# Patient Record
Sex: Male | Born: 1985 | Race: White | Hispanic: No | Marital: Single | Smoking: Current some day smoker
Health system: Southern US, Community
[De-identification: ages and names within clinical notes are randomized; demographics above are authoritative.]

## PROBLEM LIST (undated history)

## (undated) DIAGNOSIS — I10 Essential (primary) hypertension: Secondary | ICD-10-CM

## (undated) DIAGNOSIS — K219 Gastro-esophageal reflux disease without esophagitis: Secondary | ICD-10-CM

## (undated) HISTORY — PX: WISDOM TOOTH EXTRACTION: SHX21

## (undated) HISTORY — DX: Essential (primary) hypertension: I10

---
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
## 2004-05-20 ENCOUNTER — Emergency Department (HOSPITAL_COMMUNITY): Admission: EM | Admit: 2004-05-20 | Discharge: 2004-05-20 | Payer: Self-pay | Admitting: Emergency Medicine

## 2006-03-09 ENCOUNTER — Emergency Department (HOSPITAL_COMMUNITY): Admission: EM | Admit: 2006-03-09 | Discharge: 2006-03-09 | Payer: Self-pay | Admitting: Emergency Medicine

## 2013-08-22 ENCOUNTER — Ambulatory Visit (INDEPENDENT_AMBULATORY_CARE_PROVIDER_SITE_OTHER): Payer: Managed Care, Other (non HMO) | Admitting: Internal Medicine

## 2013-08-22 VITALS — BP 120/80 | HR 65 | Temp 98.7°F | Resp 16 | Ht 72.25 in | Wt 232.0 lb

## 2013-08-22 DIAGNOSIS — L02226 Furuncle of umbilicus: Secondary | ICD-10-CM

## 2013-08-22 DIAGNOSIS — L02239 Carbuncle of trunk, unspecified: Secondary | ICD-10-CM

## 2013-08-22 MED ORDER — CEPHALEXIN 500 MG PO CAPS: 500.0000 mg | ORAL_CAPSULE | Freq: Four times a day (QID) | ORAL | Status: DC

## 2013-08-22 NOTE — Progress Notes (Signed)
  Subjective:    Patient ID: Terry Mathis, male    DOB: July 15, 1986, 27 y.o.   MRN: [id number]  HPI Blood in the belly button and tenderness in the area. He drove for 10 hours yesterday and had his seatbelt on does not know if this irritated his belly button. The blood is dried. No fever no vomiting no diarrhea. Is not actively bleeding today.   Review of Systems  Constitutional: Negative.   HENT: Negative.   Eyes: Negative.   Respiratory: Negative.   Cardiovascular: Negative.   Gastrointestinal: Negative.   Endocrine: Negative.   Genitourinary: Negative.   Musculoskeletal: Negative.   Skin: Positive for wound.       Inside belly button  Allergic/Immunologic: Negative.   Neurological: Negative.   Hematological: Negative.   Psychiatric/Behavioral: Negative.   All other systems reviewed and are negative.       Objective:   Physical Exam  Constitutional: He is oriented to person, place, and time. He appears well-developed and well-nourished.  HENT:  Head: Normocephalic and atraumatic.  Eyes: Conjunctivae and EOM are normal. Pupils are equal, round, and reactive to light.  Pulmonary/Chest: Effort normal and breath sounds normal.  Abdominal: Soft. There is tenderness.  Dried blood in the navel. Cleansed with ns on a q tip. No active bleeding. Slightly tender. No hernia no erythema to the surrounding area.  Musculoskeletal: Normal range of motion.  Neurological: He is alert and oriented to person, place, and time.  Psychiatric: He has a normal mood and affect. His behavior is normal. Judgment and thought content normal.          Assessment & Plan:  Trauma to navel. Cleansed with saline. Apply neosporin. Take antibiotic as directed.

## 2013-08-22 NOTE — Patient Instructions (Signed)
Cleanse your navel with soap and water. Apply neosporin. Keflex as directed. Return for reeval if no improvement.

## 2013-10-12 ENCOUNTER — Ambulatory Visit (INDEPENDENT_AMBULATORY_CARE_PROVIDER_SITE_OTHER): Payer: Managed Care, Other (non HMO) | Admitting: Family Medicine

## 2013-10-12 VITALS — BP 128/84 | HR 86 | Temp 98.5°F | Resp 18 | Ht 67.0 in | Wt 231.0 lb

## 2013-10-12 DIAGNOSIS — L0390 Cellulitis, unspecified: Secondary | ICD-10-CM

## 2013-10-12 DIAGNOSIS — R21 Rash and other nonspecific skin eruption: Secondary | ICD-10-CM

## 2013-10-12 DIAGNOSIS — L0291 Cutaneous abscess, unspecified: Secondary | ICD-10-CM

## 2013-10-12 MED ORDER — DOXYCYCLINE HYCLATE 100 MG PO TABS: 100.0000 mg | ORAL_TABLET | Freq: Two times a day (BID) | ORAL | Status: DC

## 2013-10-12 NOTE — Progress Notes (Signed)
Subjective:    Patient ID: Terry Mathis, male    DOB: 06-09-1986, 27 y.o.   MRN: [id number]  HPI This chart was scribed for Terry Mathis-MD, by Ladona Ridgel Nefertari Rebman, Scribe. This patient was seen in room 13 and the patient's care was started at 6:47 PM.  HPI Comments: WILIAM Mathis is a 27 y.o. male who works as a Games developer for [employer] and lives at home.  Today he presents to the [facility] complaining of a constant, gradually worsened, itchy diffuse and erythematous rash to his lower abdomen, onset 3 days ago. He reports mild associated pain and discomfort to the rash. He denies any associated fever/chills nausea or emesis episodes.  History reviewed. No pertinent past medical history.  History reviewed. No pertinent past surgical history.  Family History  Problem Relation Age of Onset  . Diabetes Mother   . Seizures Mother   . Hypertension Father     History   Social History  . Marital Status: Single    Spouse Name: N/A    Number of Children: N/A  . Years of Education: N/A   Occupational History  . Not on file.   Social History Main Topics  . Smoking status: Current Every Eder Macek Smoker  . Smokeless tobacco: Not on file  . Alcohol Use: Not on file  . Drug Use: Not on file  . Sexual Activity: Not on file   Other Topics Concern  . Not on file   Social History Narrative  . No narrative on file    No Known Allergies  There are no active problems to display for this patient.     No results found for this or any previous visit.  1. Rash   2. Cellulitis     Meds ordered this encounter  Medications  . doxycycline (VIBRA-TABS) 100 MG tablet    Sig: Take 1 tablet (100 mg total) by mouth 2 (two) times daily.    Dispense:  20 tablet    Refill:  0      Review of Systems  Constitutional: Negative for fever and chills.  Respiratory: Negative for cough and shortness of breath.   Cardiovascular: Negative for chest pain.  Gastrointestinal:  Negative for nausea and vomiting.  Skin: Positive for rash (itchy red rash lower abdomen).   Triage Vitals: BP 128/84  Pulse 86  Temp(Src) 98.5 F (36.9 C)  Resp 18  Ht 5\' 7"  (1.702 m)  Wt 231 lb (104.781 kg)  BMI 36.17 kg/m2  SpO2 99%     Objective:   Physical Exam Physical Exam  Nursing note and vitals reviewed. Constitutional: Patient is oriented to person, place, and time. Patient appears well-developed and well-nourished. No distress.  HENT:  Head: Normocephalic and atraumatic.  Neck: Neck supple. No tracheal deviation present.  Cardiovascular: Normal rate, regular rhythm and normal heart sounds.   No murmur heard. Pulmonary/Chest: Effort normal and breath sounds normal. No respiratory distress. Patient has no wheezes. Patient has no rales.  Musculoskeletal: Normal range of motion.  Neurological: Patient is alert and oriented to person, place, and time.  Skin: Skin is warm and dry. Ellptical and erythematous patches along belt line both RLQ and LLQ. W/ satellite pustules. Psychiatric: Patient has a normal mood and affect. Patient's behavior is normal.         Assessment & Plan:   Rash - Plan: doxycycline (VIBRA-TABS) 100 MG tablet  Cellulitis - Plan: doxycycline (VIBRA-TABS) 100 MG tablet  Signed,  Elvina Sidle, MD

## 2013-11-15 ENCOUNTER — Ambulatory Visit (INDEPENDENT_AMBULATORY_CARE_PROVIDER_SITE_OTHER): Payer: Managed Care, Other (non HMO) | Admitting: Family Medicine

## 2013-11-15 VITALS — BP 146/90 | HR 94 | Temp 98.5°F | Resp 16 | Ht 68.0 in | Wt 230.2 lb

## 2013-11-15 DIAGNOSIS — K2970 Gastritis, unspecified, without bleeding: Secondary | ICD-10-CM

## 2013-11-15 DIAGNOSIS — R21 Rash and other nonspecific skin eruption: Secondary | ICD-10-CM

## 2013-11-15 DIAGNOSIS — R030 Elevated blood-pressure reading, without diagnosis of hypertension: Secondary | ICD-10-CM

## 2013-11-15 DIAGNOSIS — R1013 Epigastric pain: Secondary | ICD-10-CM

## 2013-11-15 DIAGNOSIS — F17200 Nicotine dependence, unspecified, uncomplicated: Secondary | ICD-10-CM

## 2013-11-15 DIAGNOSIS — IMO0001 Reserved for inherently not codable concepts without codable children: Secondary | ICD-10-CM

## 2013-11-15 LAB — POCT SKIN KOH: Skin KOH, POC: NEGATIVE

## 2013-11-15 LAB — POCT CBC
HCT, POC: 42.9 % — AB (ref 43.5–53.7)
Hemoglobin: 13.7 g/dL — AB (ref 14.1–18.1)
MCHC: 31.9 g/dL (ref 31.8–35.4)
MPV: 8 fL (ref 0–99.8)
POC Granulocyte: 4.1 (ref 2–6.9)
POC LYMPH PERCENT: 34.8 %L (ref 10–50)
RBC: 4.73 M/uL (ref 4.69–6.13)
RDW, POC: 12.9 %

## 2013-11-15 MED ORDER — TRIAMCINOLONE ACETONIDE 0.1 % EX CREA: 1.0000 "application " | TOPICAL_CREAM | Freq: Two times a day (BID) | CUTANEOUS | Status: DC

## 2013-11-15 MED ORDER — OMEPRAZOLE 40 MG PO CPDR: 40.0000 mg | DELAYED_RELEASE_CAPSULE | Freq: Every day | ORAL | Status: DC

## 2013-11-15 MED ORDER — SUCRALFATE 1 G PO TABS: 1.0000 g | ORAL_TABLET | Freq: Three times a day (TID) | ORAL | Status: DC

## 2013-11-15 NOTE — Progress Notes (Signed)
Subjective:  This chart was scribed for Norberto Sorenson, MD by Quintella Reichert, ED scribe.  This patient was seen in [facility] Room 12 and the patient's care was started at 6:43 PM.   Patient ID: Terry Mathis, male    DOB: [birth year], 27 y.o.   MRN: [id number]  Chief Complaint  Patient presents with  . Abdominal Pain    Upper, thinks may be from heartburn, X 1 month    HPI  HPI Comments: Terry Mathis is a 27 y.o. male who presents to [facility] complaining of one month of mild-to-moderate epigastric abdominal pain.  Pt attributes his pain to GERD.  He states that he used to have "burning" chest pain due to GERD and has been taking OTC Prilosec 1x/day for 8-9 months with complete relief of chest pain.  However over the past month in spite of continuing to use Prilosec he has developed a new painful sensation to the left and central epigastric abdomen with rare radiation to the back.  He drinks socially from around 5PM-2AM every weekend and states his pain is worsened the day after drinking alcohol.  He does not drink EtOH at all during the week.  He is also a current half-pack-per-day smoker and states his pain is worsened by smoking.  It is greatly worsened if he forgets to take his Prilosec. Worse w/ spicy foods - esp [place]. Yesterday he also felt nauseated on waking but he denies vomiting.  He denies constipation, diarrhea, blood in stools, or melena.  Pt states he does have a very unhealthy diet - eats out almost constantly, lots of fast food.  Pt was seen here on 11/26 for a "staph infection" on his lower abdomen as well as a rash.  He was prescribed doxycycline which helped the staph infection. Was told to use otc lamisil for his rash but that seems to be getting worse. Very itchy - more so when scratching it.  History reviewed. No pertinent past medical history.  Current Outpatient Prescriptions on File Prior to Visit  Medication Sig Dispense Refill  . doxycycline (VIBRA-TABS) 100 MG tablet  Take 1 tablet (100 mg total) by mouth 2 (two) times daily.  20 tablet  0   No current facility-administered medications on file prior to visit.    No Known Allergies   Review of Systems  Constitutional: Positive for appetite change. Negative for fever, chills, diaphoresis and activity change.  Respiratory: Negative for shortness of breath.   Cardiovascular: Negative for chest pain.  Gastrointestinal: Positive for nausea and abdominal pain. Negative for vomiting, diarrhea, constipation, blood in stool, abdominal distention, anal bleeding and rectal pain.  Genitourinary: Negative for dysuria, frequency and decreased urine volume.  Skin: Positive for color change and rash. Negative for wound.  Hematological: Negative for adenopathy.      BP 146/90  Pulse 94  Temp(Src) 98.5 F (36.9 C) (Oral)  Resp 16  Ht 5\' 8"  (1.727 m)  Wt 230 lb 3.2 oz (104.418 kg)  BMI 35.01 kg/m2  SpO2 98%  Objective:   Physical Exam  Nursing note and vitals reviewed. Constitutional: He is oriented to person, place, and time. He appears well-developed and well-nourished. No distress.  HENT:  Head: Normocephalic and atraumatic.  Eyes: EOM are normal.  Neck: Neck supple. No tracheal deviation present.  Cardiovascular: Normal rate, regular rhythm and normal heart sounds.   No murmur heard. Pulmonary/Chest: Effort normal and breath sounds normal. No respiratory distress. He has no wheezes. He has  no rales.  Abdominal: Soft. Bowel sounds are normal. He exhibits no distension and no mass. There is no hepatosplenomegaly. There is no tenderness.  Musculoskeletal: Normal range of motion.  Neurological: He is alert and oriented to person, place, and time.  Skin: Skin is warm and dry. Rash noted.  2x4-cm oval erythematous plaque, with fine scale  Psychiatric: He has a normal mood and affect. His behavior is normal.    Results for orders placed in visit on 11/15/13  POCT CBC      Result Value Range   WBC 7.0   4.6 - 10.2 K/uL   Lymph, poc 2.4  0.6 - 3.4   POC LYMPH PERCENT 34.8  10 - 50 %L   MID (cbc) 0.4  0 - 0.9   POC MID % 6.3  0 - 12 %M   POC Granulocyte 4.1  2 - 6.9   Granulocyte percent 58.9  37 - 80 %G   RBC 4.73  4.69 - 6.13 M/uL   Hemoglobin 13.7 (*) 14.1 - 18.1 g/dL   HCT, POC 40.9 (*) 81.1 - 53.7 %   MCV 90.7  80 - 97 fL   MCH, POC 29.0  27 - 31.2 pg   MCHC 31.9  31.8 - 35.4 g/dL   RDW, POC 91.4     Platelet Count, POC 236  142 - 424 K/uL   MPV 8.0  0 - 99.8 fL  POCT SKIN KOH      Result Value Range   Skin KOH, POC Negative        Assessment & Plan:  Tobacco use disorder - not ready to quit.  Elevated blood pressure - Normal prior - rec dec EtoH, smoking, salt, inc water.  Abdominal pain, epigastric - Plan: POCT CBC, Comprehensive metabolic panel, H. pylori antibody, IgG, Lipase  Rash and nonspecific skin eruption - Plan: POCT Skin KOH - I suspect this is a contact dermatitis from his belt buckle - location correlates exactly. Pt doubtful as has had all his 3 belts that he reg wears for yrs but advised to do trial of elimination w/ his belts - wear each one for a week and see if rash gets any better or worse during that wk.  Can use top TAC but will not fix prob unless cause is removed.  Unspecified gastritis and gastroduodenitis without mention of hemorrhage - Pt advised that he needs rectal exam for hemoccult of stool due to possibility of bleeding ulcer or bleeding gastritis due to his anemia - he refuses today and wants to proceed w/ trx w/ rx dose ppi and trial of carafate. If sxs do not improve, will RTC for further eval inc HOC. Pt understands and agrees to plan.  Meds ordered this encounter  Medications  . DISCONTD: omeprazole (PRILOSEC) 20 MG capsule    Sig: Take 20 mg by mouth daily.  Marland Kitchen omeprazole (PRILOSEC) 40 MG capsule    Sig: Take 1 capsule (40 mg total) by mouth daily.    Dispense:  30 capsule    Refill:  3  . sucralfate (CARAFATE) 1 G tablet    Sig: Take  1 tablet (1 g total) by mouth 4 (four) times daily -  with meals and at bedtime.    Dispense:  120 tablet    Refill:  0  . triamcinolone cream (KENALOG) 0.1 %    Sig: Apply 1 application topically 2 (two) times daily.    Dispense:  30 g  Refill:  0    I personally performed the services described in this documentation, which was scribed in my presence. The recorded information has been reviewed and considered, and addended by me as needed.  Delman Cheadle, MD MPH

## 2013-11-15 NOTE — Patient Instructions (Signed)
Gastritis, Adult Gastritis is soreness and swelling (inflammation) of the lining of the stomach. Gastritis can develop as a sudden onset (acute) or long-term (chronic) condition. If gastritis is not treated, it can lead to stomach bleeding and ulcers. CAUSES  Gastritis occurs when the stomach lining is weak or damaged. Digestive juices from the stomach then inflame the weakened stomach lining. The stomach lining may be weak or damaged due to viral or bacterial infections. One common bacterial infection is the Helicobacter pylori infection. Gastritis can also result from excessive alcohol consumption, taking certain medicines, or having too much acid in the stomach.  SYMPTOMS  In some cases, there are no symptoms. When symptoms are present, they may include:  Pain or a burning sensation in the upper abdomen.  Nausea.  Vomiting.  An uncomfortable feeling of fullness after eating. DIAGNOSIS  Your caregiver may suspect you have gastritis based on your symptoms and a physical exam. To determine the cause of your gastritis, your caregiver may perform the following:  Blood or stool tests to check for the H pylori bacterium.  Gastroscopy. A thin, flexible tube (endoscope) is passed down the esophagus and into the stomach. The endoscope has a light and camera on the end. Your caregiver uses the endoscope to view the inside of the stomach.  Taking a tissue sample (biopsy) from the stomach to examine under a microscope. TREATMENT  Depending on the cause of your gastritis, medicines may be prescribed. If you have a bacterial infection, such as an H pylori infection, antibiotics may be given. If your gastritis is caused by too much acid in the stomach, H2 blockers or antacids may be given. Your caregiver may recommend that you stop taking aspirin, ibuprofen, or other nonsteroidal anti-inflammatory drugs (NSAIDs). HOME CARE INSTRUCTIONS  Only take over-the-counter or prescription medicines as directed by  your caregiver.  If you were given antibiotic medicines, take them as directed. Finish them even if you start to feel better.  Drink enough fluids to keep your urine clear or pale yellow.  Avoid foods and drinks that make your symptoms worse, such as:  Caffeine or alcoholic drinks.  Chocolate.  Peppermint or mint flavorings.  Garlic and onions.  Spicy foods.  Citrus fruits, such as oranges, lemons, or limes.  Tomato-based foods such as sauce, chili, salsa, and pizza.  Fried and fatty foods.  Eat small, frequent meals instead of large meals. SEEK IMMEDIATE MEDICAL CARE IF:   You have black or dark red stools.  You vomit blood or material that looks like coffee grounds.  You are unable to keep fluids down.  Your abdominal pain gets worse.  You have a fever.  You do not feel better after 1 week.  You have any other questions or concerns. MAKE SURE YOU:  Understand these instructions.  Will watch your condition.  Will get help right away if you are not doing well or get worse. Document Released: 10/28/2001 Document Revised: 05/04/2012 Document Reviewed: 12/17/2011 [facility] Patient Information 2014 [place], [place]. Diet for Gastroesophageal Reflux Disease, Adult Reflux (acid reflux) is when acid from your stomach flows up into the esophagus. When acid comes in contact with the esophagus, the acid causes irritation and soreness (inflammation) in the esophagus. When reflux happens often or so severely that it causes damage to the esophagus, it is called gastroesophageal reflux disease (GERD). Nutrition therapy can help ease the discomfort of GERD. FOODS OR DRINKS TO AVOID OR LIMIT  Smoking or chewing tobacco. Nicotine is one of  the most potent stimulants to acid production in the gastrointestinal tract.  Caffeinated and decaffeinated coffee and black tea.  Regular or low-calorie carbonated beverages or energy drinks (caffeine-free carbonated beverages are allowed).    Strong spices, such as black pepper, white pepper, red pepper, cayenne, curry powder, and chili powder.  Peppermint or spearmint.  Chocolate.  High-fat foods, including meats and fried foods. Extra added fats including oils, butter, salad dressings, and nuts. Limit these to less than 8 tsp per day.  Fruits and vegetables if they are not tolerated, such as citrus fruits or tomatoes.  Alcohol.  Any food that seems to aggravate your condition. If you have questions regarding your diet, call your caregiver or a registered dietitian. OTHER THINGS THAT MAY HELP GERD INCLUDE:   Eating your meals slowly, in a relaxed setting.  Eating 5 to 6 small meals per day instead of 3 large meals.  Eliminating food for a period of time if it causes distress.  Not lying down until 3 hours after eating a meal.  Keeping the head of your bed raised 6 to 9 inches (15 to 23 cm) by using a foam wedge or blocks under the legs of the bed. Lying flat may make symptoms worse.  Being physically active. Weight loss may be helpful in reducing reflux in overweight or obese adults.  Wear loose fitting clothing EXAMPLE MEAL PLAN This meal plan is approximately 2,000 calories based on https://www.bernard.org/ meal planning guidelines. Breakfast   cup cooked oatmeal.  1 cup strawberries.  1 cup low-fat milk.  1 oz almonds. Snack  1 cup cucumber slices.  6 oz yogurt (made from low-fat or fat-free milk). Lunch  2 slice whole-wheat bread.  2 oz sliced [place].  2 tsp mayonnaise.  1 cup blueberries.  1 cup snap peas. Snack  6 whole-wheat crackers.  1 oz string cheese. Dinner   cup brown rice.  1 cup mixed veggies.  1 tsp olive oil.  3 oz grilled fish. Document Released: 11/03/2005 Document Revised: 01/26/2012 Document Reviewed: 09/19/2011 [facility] Patient Information 2014 [place], [place]. DASH Diet The DASH diet stands for "Dietary Approaches to Stop Hypertension." It is a healthy  eating plan that has been shown to reduce high blood pressure (hypertension) in as little as 14 days, while also possibly providing other significant health benefits. These other health benefits include reducing the risk of breast cancer after menopause and reducing the risk of type 2 diabetes, heart disease, colon cancer, and stroke. Health benefits also include weight loss and slowing kidney failure in patients with chronic kidney disease.  DIET GUIDELINES  Limit salt (sodium). Your diet should contain less than 1500 mg of sodium daily.  Limit refined or processed carbohydrates. Your diet should include mostly whole grains. Desserts and added sugars should be used sparingly.  Include small amounts of heart-healthy fats. These types of fats include nuts, oils, and tub margarine. Limit saturated and trans fats. These fats have been shown to be harmful in the body. CHOOSING FOODS  The following food groups are based on a 2000 calorie diet. See your Registered Dietitian for individual calorie needs. Grains and Grain Products (6 to 8 servings daily)  Eat More Often: Whole-wheat bread, brown rice, whole-grain or wheat pasta, quinoa, popcorn without added fat or salt (air popped).  Eat Less Often: White bread, white pasta, white rice, cornbread. Vegetables (4 to 5 servings daily)  Eat More Often: Fresh, frozen, and canned vegetables. Vegetables may be raw, steamed, roasted, or  grilled with a minimal amount of fat.  Eat Less Often/Avoid: Creamed or fried vegetables. Vegetables in a cheese sauce. Fruit (4 to 5 servings daily)  Eat More Often: All fresh, canned (in natural juice), or frozen fruits. Dried fruits without added sugar. One hundred percent fruit juice ( cup [237 mL] daily).  Eat Less Often: Dried fruits with added sugar. Canned fruit in light or heavy syrup. [employer], Fish, and Poultry (2 servings or less daily. One serving is 3 to 4 oz [85-114 g]).  Eat More Often: Ninety percent  or leaner ground beef, tenderloin, sirloin. Round cuts of beef, chicken breast, [place] breast. All fish. Grill, bake, or broil your meat. Nothing should be fried.  Eat Less Often/Avoid: Fatty cuts of meat, [place], or chicken leg, thigh, or wing. Fried cuts of meat or fish. Dairy (2 to 3 servings)  Eat More Often: Low-fat or fat-free milk, low-fat plain or light yogurt, reduced-fat or part-skim cheese.  Eat Less Often/Avoid: Milk (whole, 2%).Whole milk yogurt. Full-fat cheeses. Nuts, Seeds, and Legumes (4 to 5 servings per week)  Eat More Often: All without added salt.  Eat Less Often/Avoid: Salted nuts and seeds, canned beans with added salt. Fats and Sweets (limited)  Eat More Often: Vegetable oils, tub margarines without trans fats, sugar-free gelatin. Mayonnaise and salad dressings.  Eat Less Often/Avoid: Coconut oils, palm oils, butter, stick margarine, cream, half and half, cookies, candy, pie. FOR MORE INFORMATION The Dash Diet Eating Plan: www.dashdiet.org Document Released: 10/23/2011 Document Revised: 01/26/2012 Document Reviewed: 10/23/2011 [facility] Patient Information 2014 [place], [place].

## 2013-11-16 LAB — COMPREHENSIVE METABOLIC PANEL
ALT: 44 U/L (ref 0–53)
AST: 27 U/L (ref 0–37)
Calcium: 10 mg/dL (ref 8.4–10.5)
Creat: 0.96 mg/dL (ref 0.50–1.35)
Sodium: 138 mEq/L (ref 135–145)

## 2013-11-16 LAB — LIPASE: Lipase: 21 U/L (ref 0–75)

## 2013-11-18 ENCOUNTER — Encounter: Payer: Self-pay | Admitting: Family Medicine

## 2013-11-18 LAB — H. PYLORI ANTIBODY, IGG: H Pylori IgG: 0.45 {ISR}

## 2016-07-18 ENCOUNTER — Ambulatory Visit (INDEPENDENT_AMBULATORY_CARE_PROVIDER_SITE_OTHER): Payer: Self-pay | Admitting: Urgent Care

## 2016-07-18 ENCOUNTER — Encounter: Payer: Self-pay | Admitting: Urgent Care

## 2016-07-18 VITALS — BP 128/80 | HR 95 | Temp 98.4°F | Resp 18 | Ht 69.5 in | Wt 262.6 lb

## 2016-07-18 DIAGNOSIS — Z021 Encounter for pre-employment examination: Secondary | ICD-10-CM

## 2016-07-18 DIAGNOSIS — Z024 Encounter for examination for driving license: Secondary | ICD-10-CM

## 2016-07-18 NOTE — Patient Instructions (Signed)

## 2016-07-18 NOTE — Progress Notes (Signed)
Commercial Driver Medical Examination   Terry Mathis is a 30 y.o. 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 The patient reports no medical issues. Denies dizziness, chronic headache, blurred vision, chest pain, shortness of breath, heart racing, palpitations, nausea, vomiting, abdominal pain, hematuria, musculoskeletal pain, lower leg swelling.   The following portions of the patient's history were reviewed and updated as appropriate: allergies, current medications, past family history, past medical history, past social history and past surgical history.  Objective:   BP 128/80 (BP Location: Right Arm, Patient Position: Sitting, Cuff Size: Large)   Pulse 95   Temp 98.4 F (36.9 C)   Resp 18   Ht 5' 9.5" (1.765 m)   Wt 262 lb 9.6 oz (119.1 kg)   SpO2 98%   BMI 38.22 kg/m   Vision/hearing:  Visual Acuity Screening   Right eye Left eye Both eyes  Without correction: 20/13 20/13-2 20/13  With correction:     Comments: Peripheral Vision: Right eye 85 degrees. Left eye 85 degrees. The patient can distinguish the colors red, amber and green.  Hearing Screening Comments: The patient was able to hear a forced whisper from 10 feet.  Patient can recognize and distinguish among traffic control signals and devices showing standard red, green, and amber colors.  Corrective lenses required: No  Monocular Vision?: No  Hearing aid requirement: No  Physical Exam  Constitutional: He is oriented to person, place, and time. He appears well-developed and well-nourished.  HENT:  TM's intact bilaterally, no effusions or erythema. Nasal turbinates pink and moist, nasal passages patent. No sinus tenderness. Oropharynx clear, mucous membranes moist, dentition in good repair.  Eyes: Conjunctivae and EOM are normal. Pupils are equal, round, and reactive to light. Right eye exhibits no discharge. Left eye exhibits no discharge. No scleral icterus.  Neck: Normal range of motion. Neck supple.  No thyromegaly present.  Cardiovascular: Normal rate, regular rhythm and intact distal pulses.  Exam reveals no gallop and no friction rub.   No murmur heard. Pulmonary/Chest: No stridor. No respiratory distress. He has no wheezes. He has no rales.  Abdominal: Soft. Bowel sounds are normal. He exhibits no distension and no mass. There is no tenderness.  Musculoskeletal: Normal range of motion. He exhibits no edema or tenderness.  Lymphadenopathy:    He has no cervical adenopathy.  Neurological: He is alert and oriented to person, place, and time. He has normal reflexes.  Skin: Skin is warm and dry. No rash noted. No erythema. No pallor.  Psychiatric: He has a normal mood and affect.   Labs: Comments: SP GR: 1.025 PRO:NEG BLO:NEG GLU:NEG  Assessment:    Healthy male exam.  Meets standards in 2949 CFR 391.41;  qualifies for 2 year certificate.    Plan:   Medical examiners certificate completed and printed. Return as needed.

## 2021-05-20 ENCOUNTER — Other Ambulatory Visit: Payer: Self-pay

## 2021-05-20 ENCOUNTER — Encounter (HOSPITAL_COMMUNITY): Payer: Self-pay | Admitting: Emergency Medicine

## 2021-05-20 ENCOUNTER — Emergency Department (HOSPITAL_COMMUNITY): Payer: 59

## 2021-05-20 ENCOUNTER — Emergency Department (HOSPITAL_COMMUNITY)
Admission: EM | Admit: 2021-05-20 | Discharge: 2021-05-20 | Disposition: A | Discharge status: Home / Self Care | Payer: 59 | Attending: Emergency Medicine | Admitting: Emergency Medicine

## 2021-05-20 DIAGNOSIS — R739 Hyperglycemia, unspecified: Secondary | ICD-10-CM

## 2021-05-20 DIAGNOSIS — F17200 Nicotine dependence, unspecified, uncomplicated: Secondary | ICD-10-CM | POA: Diagnosis not present

## 2021-05-20 DIAGNOSIS — R200 Anesthesia of skin: Secondary | ICD-10-CM | POA: Insufficient documentation

## 2021-05-20 DIAGNOSIS — I10 Essential (primary) hypertension: Secondary | ICD-10-CM | POA: Insufficient documentation

## 2021-05-20 DIAGNOSIS — R748 Abnormal levels of other serum enzymes: Secondary | ICD-10-CM

## 2021-05-20 DIAGNOSIS — R079 Chest pain, unspecified: Secondary | ICD-10-CM | POA: Diagnosis not present

## 2021-05-20 DIAGNOSIS — M5412 Radiculopathy, cervical region: Secondary | ICD-10-CM

## 2021-05-20 HISTORY — DX: Gastro-esophageal reflux disease without esophagitis: K21.9

## 2021-05-20 LAB — COMPREHENSIVE METABOLIC PANEL
ALT: 218 U/L — ABNORMAL HIGH (ref 0–44)
AST: 147 U/L — ABNORMAL HIGH (ref 15–41)
Albumin: 4.5 g/dL (ref 3.5–5.0)
Alkaline Phosphatase: 90 U/L (ref 38–126)
Anion gap: 8 (ref 5–15)
BUN: 11 mg/dL (ref 6–20)
CO2: 28 mmol/L (ref 22–32)
Calcium: 9.9 mg/dL (ref 8.9–10.3)
Chloride: 99 mmol/L (ref 98–111)
Creatinine, Ser: 0.82 mg/dL (ref 0.61–1.24)
GFR, Estimated: >60 mL/min (ref >60)
Glucose, Bld: 269 mg/dL — ABNORMAL HIGH (ref 70–99)
Potassium: 4.2 mmol/L (ref 3.5–5.1)
Sodium: 135 mmol/L (ref 135–145)
Total Bilirubin: 0.7 mg/dL (ref 0.3–1.2)
Total Protein: 7.4 g/dL (ref 6.5–8.1)

## 2021-05-20 LAB — CBC WITH DIFFERENTIAL/PLATELET
Abs Immature Granulocytes: 0.09 10*3/uL — ABNORMAL HIGH (ref 0.00–0.07)
Basophils Absolute: 0.1 10*3/uL (ref 0.0–0.1)
Basophils Relative: 1 %
Eosinophils Absolute: 0.2 10*3/uL (ref 0.0–0.5)
Eosinophils Relative: 3 %
HCT: 49.1 % (ref 39.0–52.0)
Hemoglobin: 16.6 g/dL (ref 13.0–17.0)
Immature Granulocytes: 1 %
Lymphocytes Relative: 39 %
Lymphs Abs: 3.1 10*3/uL (ref 0.7–4.0)
MCH: 28.4 pg (ref 26.0–34.0)
MCHC: 33.8 g/dL (ref 30.0–36.0)
MCV: 84.1 fL (ref 80.0–100.0)
Monocytes Absolute: 0.7 10*3/uL (ref 0.1–1.0)
Monocytes Relative: 9 %
Neutro Abs: 3.8 10*3/uL (ref 1.7–7.7)
Neutrophils Relative %: 47 %
Platelets: 276 10*3/uL (ref 150–400)
RBC: 5.84 MIL/uL — ABNORMAL HIGH (ref 4.22–5.81)
RDW: 11.8 % (ref 11.5–15.5)
WBC: 8 10*3/uL (ref 4.0–10.5)
nRBC: 0 % (ref 0.0–0.2)

## 2021-05-20 LAB — TROPONIN I (HIGH SENSITIVITY)
Troponin I (High Sensitivity): 4 ng/L (ref <18)
Troponin I (High Sensitivity): 4 ng/L (ref <18)

## 2021-05-20 LAB — LIPASE, BLOOD: Lipase: 44 U/L (ref 11–51)

## 2021-05-20 LAB — HEMOGLOBIN A1C
Hgb A1c MFr Bld: 9.7 % — ABNORMAL HIGH (ref 4.8–5.6)
Mean Plasma Glucose: 231.69 mg/dL

## 2021-05-20 MED ORDER — ALUM & MAG HYDROXIDE-SIMETH 200-200-20 MG/5ML PO SUSP
30.0000 mL | Freq: Once | ORAL | Status: AC
  Administered 2021-05-20: 30 mL via ORAL
  Filled 2021-05-20: qty 30

## 2021-05-20 MED ORDER — AMLODIPINE BESYLATE 5 MG PO TABS: 5.0000 mg | ORAL_TABLET | Freq: Every day | ORAL | 0 refills | Status: AC

## 2021-05-20 MED ORDER — FAMOTIDINE 20 MG PO TABS: 20.0000 mg | ORAL_TABLET | Freq: Two times a day (BID) | ORAL | 0 refills | Status: DC

## 2021-05-20 MED ORDER — AMLODIPINE BESYLATE 5 MG PO TABS
10.0000 mg | ORAL_TABLET | Freq: Once | ORAL | Status: AC
  Administered 2021-05-20: 10 mg via ORAL
  Filled 2021-05-20: qty 2

## 2021-05-20 NOTE — ED Triage Notes (Signed)
Patient reports left arm numbness Saturday night during dinner. States had intermittent arm numbness yesterday. Starting this am having mid chest pain and upper back pain. States recently started on amoxicillin for tooth.

## 2021-05-20 NOTE — Discharge Instructions (Addendum)
We talked about your chest pain work-up today.  We do not see signs of a heart attack at this time.  I feel like your chest pain is most likely related to reflux, I prescribed you Pepcid to start taking every day.  Take this twice a day 30 minutes before eating meals.  You can continue your Nexium.  We did talk about the importance of following up with a cardiologist.  You do have some risk factors for heart disease.  None of our tests in the emergency department can definitively rule out heart condition.  You may need additional testing as an outpatient.  It is important that you follow-up with them in the office.  Please be aware, there were a few small abnormalities in your work-up today.  Your blood sugar was higher than normal.  This may be a sign of developing diabetes.  I did send a test for this, but we will not have the results for a few days.  You can follow-up with this issue at the cardiologist - but you really need a primary care provider to manage this problem.

## 2021-05-20 NOTE — ED Provider Notes (Signed)
[facility]   CSN: [id number] Arrival date & time: 05/20/21  1209     History Chief Complaint  Patient presents with   Chest Pain    Raeanne BarryJames A Ruddell is a 35 y.o. male presented emergency department with chest pain and arm numbness.  The patient reports that he has a history of hypertension, does not currently have a doctor and does not take any medications.  He reports that he had mouth pain a week ago and was prescribed amoxicillin on telehealth for "maybe a tooth infection".  Has not been able to see a dentist.  He has been taken amoxicillin since then.  He reports 3 days ago he noticed that he was having intermittent numbness that radiated from his left side of the neck down to his left hand.  It seemed to involve the ulnar aspect of the arm and hand.  It came and went.  That pain is since resolved today.  This morning he woke up with feeling of discomfort in his mid chest.  He says it been persistent since this morning, currently 3 out of 10 in intensity.  Does not radiate anywhere.  He also reported recently was his birthday this past week and he went out several times drinking alcohol.  He does suffer from reflux and takes Nexium every other day.  He says he has never had "heartburn from this though".  He denies any significant family history of MI under the age of 37.  He reports he has hypertension.  He has not seen a doctor, is not sure about diabetes or high cholesterol.  He does smoke occasionally, approximately every other day, cigarettes.  No hemoptysis or asymmetric LE edema. Patient denies personal or family history of DVT or PE. No recent hormone use (including OCP); travel for >6 hours; prolonged immobilization for greater than 3 days; surgeries or trauma in the last 4 weeks; or malignancy with treatment within 6 months.   HPI     Past Medical History:  Diagnosis Date   GERD (gastroesophageal reflux disease)      There are no problems to display for this patient.   No past surgical history on file.     Family History  Problem Relation Age of Onset   Diabetes Mother    Seizures Mother    Hypertension Father     Social History   Tobacco Use   Smoking status: Every Day    Pack years: 0.00    Home Medications Prior to Admission medications   Medication Sig Start Date End Date Taking? Authorizing Provider  amLODipine (NORVASC) 5 MG tablet Take 1 tablet (5 mg total) by mouth daily. 05/20/21 06/19/21 Yes Skarlet Lyons, Kermit BaloMatthew J, MD  famotidine (PEPCID) 20 MG tablet Take 1 tablet (20 mg total) by mouth 2 (two) times daily. 05/20/21 06/19/21 Yes Ricco Dershem, Kermit BaloMatthew J, MD    Allergies    Patient has no known allergies.  Review of Systems   Review of Systems  Constitutional:  Negative for chills and fever.  Eyes:  Negative for pain and visual disturbance.  Respiratory:  Negative for cough and shortness of breath.   Cardiovascular:  Positive for chest pain. Negative for palpitations.  Gastrointestinal:  Negative for abdominal pain and vomiting.  Musculoskeletal:  Negative for arthralgias and back pain.  Skin:  Negative for color change and rash.  Neurological:  Positive for numbness. Negative for syncope.  All other systems reviewed and are negative.  Physical Exam Updated Vital Signs BP (!) 144/87   Pulse 90   Temp 98.3 F (36.8 C) (Oral)   Resp 18   Ht 5\' 9"  (1.753 m)   Wt 117.9 kg   SpO2 98%   BMI 38.40 kg/m   Physical Exam Constitutional:      General: He is not in acute distress.    Appearance: He is obese.  HENT:     Head: Normocephalic and atraumatic.  Eyes:     Conjunctiva/sclera: Conjunctivae normal.     Pupils: Pupils are equal, round, and reactive to light.  Cardiovascular:     Rate and Rhythm: Normal rate and regular rhythm.  Pulmonary:     Effort: Pulmonary effort is normal. No respiratory distress.  Abdominal:     General: There is no distension.     Tenderness:  There is no abdominal tenderness.  Skin:    General: Skin is warm and dry.  Neurological:     General: No focal deficit present.     Mental Status: He is alert and oriented to person, place, and time. Mental status is at baseline.     GCS: GCS eye subscore is 4. GCS verbal subscore is 5. GCS motor subscore is 6.     Cranial Nerves: No cranial nerve deficit.     Motor: No weakness.     Coordination: Coordination is intact.     Gait: Gait is intact.  Psychiatric:        Mood and Affect: Mood normal.        Behavior: Behavior normal.    ED Results / Procedures / Treatments   Labs (all labs ordered are listed, but only abnormal results are displayed) Labs Reviewed  CBC WITH DIFFERENTIAL/PLATELET - Abnormal; Notable for the following components:      Result Value   RBC 5.84 (*)    Abs Immature Granulocytes 0.09 (*)    All other components within normal limits  COMPREHENSIVE METABOLIC PANEL - Abnormal; Notable for the following components:   Glucose, Bld 269 (*)    AST 147 (*)    ALT 218 (*)    All other components within normal limits  LIPASE, BLOOD  HEMOGLOBIN A1C  TROPONIN I (HIGH SENSITIVITY)  TROPONIN I (HIGH SENSITIVITY)    EKG EKG Interpretation  Date/Time:  Monday May 20 2021 12:15:15 EDT Ventricular Rate:  102 PR Interval:  152 QRS Duration: 80 QT Interval:  318 QTC Calculation: 414 R Axis:   72 Text Interpretation: Sinus tachycardia Otherwise normal ECG Confirmed by 09[birth year]11-1995 [phone]) on 05/20/2021 2:05:19 PM  Radiology DG Chest 2 View  Result Date: 05/20/2021 CLINICAL DATA:  Chest pain. EXAM: CHEST - 2 VIEW COMPARISON:  None. FINDINGS: The heart size and mediastinal contours are within normal limits. Both lungs are clear. The visualized skeletal structures are unremarkable. IMPRESSION: No active cardiopulmonary disease. Electronically Signed   By: 07/21/2021 M.D.   On: 05/20/2021 12:50    Procedures Procedures   Medications Ordered in  ED Medications  amLODipine (NORVASC) tablet 10 mg (10 mg Oral Given 05/20/21 1453)  alum & mag hydroxide-simeth (MAALOX/MYLANTA) 200-200-20 MG/5ML suspension 30 mL (30 mLs Oral Given 05/20/21 1453)    ED Course  I have reviewed the triage vital signs and the nursing notes.  Pertinent labs & imaging results that were available during my care of the patient were reviewed by me and considered in my medical decision making (see chart for details).  This patient  presents to the Emergency Department with complaint of chest pain. This involves an extensive number of treatment options, and is a complaint that carries with it a high risk of complications and morbidity.  The differential diagnosis includes ACS vs Pneumothorax vs Reflux/Gastritis vs MSK pain vs Pneumonia vs other.  I felt this was most likely reflux or gastritis in the setting of the patient being on an antibiotic recently, and also recent heavy drinking of alcohol.  His symptoms also began when waking up, which would be atypical for heart disease, and more typical for reflux.  Advised to continue taking his Nexium and I can start him on Pepcid as well.  I will suspect his left arm pain was related to radiculopathy as he was having pain in his neck.  He does appear to have some kyphosis and sits forward hunched.  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  He does not have any objective weakness on his exam.  I doubt this is related to a stroke.  I also have a lower suspicion for aortic dissection.  I felt PE was less likely given that he has no acute risk factors, that his symptoms are intermittent, that he has no hypoxia or respiratory complaints.  I ordered, reviewed, and interpreted labs, including BMP and CBC.  There were no immediate, life-threatening emergencies found in this labwork.  The patient's troponin level was 4 -> repeat pending.  Minor elevation AST//ALT consistent with regular etoh use.  Glucose  also elevated. I ordered medication Gi cocktial, amlodipine for chest pain and hypertension I ordered imaging studies which included dg chest I independently visualized and interpreted imaging which showed no acute abnormal findings and the monitor tracing which showed NSR I personally reviewed the patients ECG which showed sinus rhythm with no acute ischemic findings  I discussed the patient's work-up with him.  He does have a heart score of 2 based on risk factors, which include untreated hypertension, likely diabetes, and smoking.  I will place referral to cardiology and advised to follow-up with them, if they feel that he is appropriate for further outpatient testing.  However, at this time I do think he is stable for discharge.  I suspect that his chest pain is more likely related to reflux, we will medicate him for this.  Close return precautions were given.  He understands there is not exhaustive work-up in the ER.  I also discussed his liver enzyme abnormalities, which I believe may be related to drinking (he drinks 10 beers or every weekend, 3 days a week).  I have a lower suspicion for acute biliary disease with no RUQ ttp, no leukocytosis, normal bilirubin levels.  He will need to have this followed up by primary care doctor.  We also talked about his high blood sugars, the fact that he may in fact be diabetic.  He does not want to start metformin or medications.  We talked about dietary changes - less carbs and sugars at home.  After the interventions stated above, I reevaluated the patient and found that they remained clinically stable.  Based on the patient's clinical exam, vital signs, risk factors, and ED testing, I felt that the patient's overall risk of life-threatening emergency such as ACS, PE, sepsis, or infection was low.  At this time, I felt the patient's presentation was most clinically consistent with reflux, but explained to the patient that this evaluation was not a definitive  diagnostic workup.  I discussed outpatient follow up with primary care provider, and provided specialist office number on the patient's discharge paper if a referral was deemed necessary.  Return precautions were discussed with the patient.  I felt the patient was clinically stable for discharge.   Clinical Course as of 05/20/21 1629  Mon May 20, 2021  1530 The patient does not want metformin started at this time.  He says he will schedule an appointment with a new PCP.  He prefers to wait until that time.  We talked about dietary changes that he can make for his elevated blood sugars. [MT]    Clinical Course User Index [MT] Terald Sleeper, MD    Final Clinical Impression(s) / ED Diagnoses Final diagnoses:  Chest pain, unspecified type  Elevated liver enzymes  Elevated blood sugar  Cervical radiculopathy    Rx / DC Orders ED Discharge Orders          Ordered    amLODipine (NORVASC) 5 MG tablet  Daily        05/20/21 1525    famotidine (PEPCID) 20 MG tablet  2 times daily        05/20/21 1525    Ambulatory referral to Cardiology        05/20/21 1527             Terald Sleeper, MD 05/20/21 1629

## 2021-05-20 NOTE — ED Provider Notes (Signed)
Second troponin is negative.  Will discharge as per prior plan.   Pricilla Loveless, MD 05/20/21 [phone]

## 2021-05-20 NOTE — ED Provider Notes (Signed)
Emergency Medicine Provider Triage Evaluation Note  Terry Mathis , a 35 y.o. male  was evaluated in triage.  Pt complains of chest pain and left arm numbness.  Reports Saturday night he started having an odd feeling in his left arm with some intermittent tingling.  Symptoms were intermittent over the past 2 days and then this morning woke up with some pain in his left chest and upper back that has been a constant dull ache, not worse with exertion or deep breath.  No prior cardiac history.  Thought this could have been a side effect from the Augmentin he was recently prescribed for a dental infection, but did a televisit with a doctor and they told him to present to the ED for further evaluation.  Review of Systems  Positive: Chest pain, left arm numbness Negative: Abdominal pain, nausea, vomiting, shortness of breath, syncope  Physical Exam  Ht 5\' 9"  (1.753 m)   Wt 117.9 kg   BMI 38.40 kg/m  Gen:   Awake, no distress   Resp:  Normal effort. CTA bilat Cardiac: RRR MSK:   Moves extremities without difficulty, left arm with 2+ distal pulses, normal sensation and strength Other:  Abdomen soft, nondistended, nontender  Medical Decision Making  Medically screening exam initiated at 12:21 PM.  Appropriate orders placed.  was informed that the remainder of the evaluation will be completed by another provider, this initial triage assessment does not replace that evaluation, and the importance of remaining in the ED until their evaluation is complete.     Raeanne Barry, PA-C 05/20/21 1227    07/21/21, MD 05/21/21 [phone]

## 2021-06-06 ENCOUNTER — Telehealth: Payer: Self-pay | Admitting: *Deleted

## 2021-07-08 ENCOUNTER — Encounter: Payer: Self-pay | Admitting: Gastroenterology

## 2021-07-23 ENCOUNTER — Ambulatory Visit: Payer: 59 | Admitting: Gastroenterology

## 2021-07-23 ENCOUNTER — Encounter: Payer: Self-pay | Admitting: Gastroenterology

## 2021-07-23 VITALS — BP 144/86 | HR 118 | Ht 69.0 in | Wt 275.0 lb

## 2021-07-23 DIAGNOSIS — E119 Type 2 diabetes mellitus without complications: Secondary | ICD-10-CM | POA: Diagnosis not present

## 2021-07-23 DIAGNOSIS — K219 Gastro-esophageal reflux disease without esophagitis: Secondary | ICD-10-CM

## 2021-07-23 DIAGNOSIS — M940 Chondrocostal junction syndrome [Tietze]: Secondary | ICD-10-CM

## 2021-07-23 DIAGNOSIS — R7401 Elevation of levels of liver transaminase levels: Secondary | ICD-10-CM | POA: Diagnosis not present

## 2021-07-23 MED ORDER — IBUPROFEN 800 MG PO TABS: 800.0000 mg | ORAL_TABLET | Freq: Three times a day (TID) | ORAL | 0 refills | Status: AC

## 2021-07-23 NOTE — Patient Instructions (Addendum)
If you are age 37 or older, your body mass index should be between 23-30. Your Body mass index is 40.61 kg/m. If this is out of the aforementioned range listed, please consider follow up with your Primary Care Provider.  If you are age 75 or younger, your body mass index should be between 19-25. Your Body mass index is 40.61 kg/m. If this is out of the aformentioned range listed, please consider follow up with your Primary Care Provider.   You have been scheduled for a Barium Esophogram at [facility] (1st floor of the hospital) on 07/30/21 at 9:30am. Please arrive 15 minutes prior to your appointment for registration. Make certain not to have anything to eat or drink 3 hours prior to your test. If you need to reschedule for any reason, please contact radiology at [phone] to do so. __________________________________________________________________ A barium swallow is an examination that concentrates on views of the esophagus. This tends to be a double contrast exam (barium and two liquids which, when combined, create a gas to distend the wall of the oesophagus) or single contrast (non-ionic iodine based). The study is usually tailored to your symptoms so a good history is essential. Attention is paid during the study to the form, structure and configuration of the esophagus, looking for functional disorders (such as aspiration, dysphagia, achalasia, motility and reflux) EXAMINATION You may be asked to change into a gown, depending on the type of swallow being performed. A radiologist and radiographer will perform the procedure. The radiologist will advise you of the type of contrast selected for your procedure and direct you during the exam. You will be asked to stand, sit or lie in several different positions and to hold a small amount of fluid in your mouth before being asked to swallow while the imaging is performed .In some instances you may be asked to swallow barium coated marshmallows  to assess the motility of a solid food bolus. The exam can be recorded as a digital or video fluoroscopy procedure. POST PROCEDURE It will take 1-2 days for the barium to pass through your system. To facilitate this, it is important, unless otherwise directed, to increase your fluids for the next 24-48hrs and to resume your normal diet.  This test typically takes about 30 minutes to perform. __________________________________________________________________________________   Your provider has requested that you go to the basement level for lab work before leaving today. Press "B" on the elevator. The lab is located at the first door on the left as you exit the elevator.  We have sent the following medications to your pharmacy for you to pick up at your convenience:  Motrin 800 mg three times daily for 7 days.  The [facility] providers would like to encourage you to use [facility] to communicate with providers for non-urgent requests or questions.  Due to long hold times on the telephone, sending your provider a message by [facility] may be a faster and more efficient way to get a response.  Please allow 48 business hours for a response.  Please remember that this is for non-urgent requests.   It was a pleasure to see you today!  Thank you for trusting me with your gastrointestinal care!    Scott E. Tomasa Rand, MD

## 2021-07-23 NOTE — Progress Notes (Signed)
HPI : Terry Mathis is a very pleasant 35 year old male who comes to [place] after a recent emergency room visit for chest pain.  States that this pain started in early July and has never really gone away since then.  He says that he had been doing a lot of exercise to try to lose weight in the weeks leading up to the onset of his pain.  He has a history of typical GERD symptoms consisting of acid regurgitation which was well controlled with Nexium.  For the past 2 months now, he has a different sensation in his chest.  He has difficulty describing the discomfort, but says sometimes it feels like there is something in his chest, and he also feels a bulge which is tender right at the bottom of his breastbone.  He has not had any recent acid regurgitation.  This chest pain seems to be worsened with activity and moving.  It is not described as a burning sensation.  He denies any dysphagia or pain with swallowing.  No nausea or vomiting. In the emergency room, troponins were negative and an EKG was unremarkable.  Lab evaluation was notable for elevated liver enzymes and hyperglycemia with a subsequent A1c of almost 10, consistent with diabetes.    Past Medical History:  Diagnosis Date   GERD (gastroesophageal reflux disease)      History reviewed. No pertinent surgical history. Family History  Problem Relation Age of Onset   Diabetes Mother    Seizures Mother    Hypertension Father    Social History   Tobacco Use   Smoking status: Some Days    Types: Cigarettes   Smokeless tobacco: Never  Vaping Use   Vaping Use: Never used  Substance Use Topics   Alcohol use: Yes    Comment: 2x week   Drug use: Never   Current Outpatient Medications  Medication Sig Dispense Refill   esomeprazole (NEXIUM) 20 MG capsule Take 20 mg by mouth daily at 12 noon.     amLODipine (NORVASC) 5 MG tablet Take 1 tablet (5 mg total) by mouth daily. 30 tablet 0   No current facility-administered medications for this  visit.   Allergies  Allergen Reactions   Amoxicillin Nausea Only    Fast heart beat     Review of Systems: All systems reviewed and negative except where noted in HPI.    No results found.  Physical Exam: Ht 5\' 9"  (1.753 m)   Wt 275 lb (124.7 kg)   BMI 40.61 kg/m  Constitutional: Pleasant,well-developed, Caucasian male in no acute distress. HEENT: Normocephalic and atraumatic. Conjunctivae are normal. No scleral icterus. Cardiovascular: Normal rate, regular rhythm.  Pulmonary/chest: Effort normal and breath sounds normal. No wheezing, rales or rhonchi. Abdominal: Soft, nondistended, nontender. Bowel sounds active throughout. There are no masses palpable. No hepatomegaly.  Tenderness to palpation at the xiphoid process Extremities: no edema Neurological: Alert and oriented to person place and time. Skin: Skin is warm and dry. No rashes noted. Psychiatric: Normal mood and affect. Behavior is normal.  CBC    Component Value Date/Time   WBC 8.0 05/20/2021 1228   RBC 5.84 (H) 05/20/2021 1228   HGB 16.6 05/20/2021 1228   HCT 49.1 05/20/2021 1228   PLT 276 05/20/2021 1228   MCV 84.1 05/20/2021 1228   MCV 90.7 11/15/2013 1859   MCH 28.4 05/20/2021 1228   MCHC 33.8 05/20/2021 1228   RDW 11.8 05/20/2021 1228   LYMPHSABS 3.1 05/20/2021 1228  MONOABS 0.7 05/20/2021 1228   EOSABS 0.2 05/20/2021 1228   BASOSABS 0.1 05/20/2021 1228    CMP     Component Value Date/Time   NA 135 05/20/2021 1228   K 4.2 05/20/2021 1228   CL 99 05/20/2021 1228   CO2 28 05/20/2021 1228   GLUCOSE 269 (H) 05/20/2021 1228   BUN 11 05/20/2021 1228   CREATININE 0.82 05/20/2021 1228   CREATININE 0.96 11/15/2013 1859   CALCIUM 9.9 05/20/2021 1228   PROT 7.4 05/20/2021 1228   ALBUMIN 4.5 05/20/2021 1228   AST 147 (H) 05/20/2021 1228   ALT 218 (H) 05/20/2021 1228   ALKPHOS 90 05/20/2021 1228   BILITOT 0.7 05/20/2021 1228   GFRNONAA >60 05/20/2021 1228     ASSESSMENT AND PLAN: 35 year old  male with 2 months of persistent chest discomfort and tenderness to palpation, worse with activity without typical GERD symptoms of heartburn and acid regurgitation.  The patient does have a history of more typical GERD symptoms, but the symptoms for the past 2 months are different than his typical GERD symptoms.  Given this, and the tenderness to palpation of his chest, I feel that a musculoskeletal etiology such as costochondritis may be the more likely cause of his persistent chest pain.  I recommended that we treat with NSAIDs (Motrin 800 mg 3 times daily) for at least a week to see if this improves his pain.  Also get a barium swallow to assess for evidence of hiatal hernia or esophagitis.  If the pain is not improving or if the barium swallow shows significant abnormalities, then we can proceed with an upper endoscopy.  He should continue on his Nexium once daily.  He was offered metformin for his newly diagnosed diabetes during his ER visit but he declined.  He still needs to establish care with a PCM to adequately address this.  With regards to his elevated liver enzymes, he is overweight, and does drink heavily on the weekends, but denies significant regular alcohol use.  I suspect his aminotransferase elevation is likely due to a combination of fatty liver and alcohol induced liver injury.  We will repeat liver enzymes and a BMP.  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 Tomasa Rand, MD [facility]   No ref. provider found

## 2021-07-30 ENCOUNTER — Ambulatory Visit (HOSPITAL_COMMUNITY): Payer: 59

## 2021-08-05 ENCOUNTER — Ambulatory Visit (HOSPITAL_COMMUNITY)
Admission: RE | Admit: 2021-08-05 | Discharge: 2021-08-05 | Disposition: A | Discharge status: Home / Self Care | Payer: 59 | Source: Ambulatory Visit | Attending: Gastroenterology | Admitting: Gastroenterology

## 2021-08-05 ENCOUNTER — Other Ambulatory Visit: Payer: Self-pay

## 2021-08-05 DIAGNOSIS — K219 Gastro-esophageal reflux disease without esophagitis: Secondary | ICD-10-CM | POA: Diagnosis present

## 2021-08-05 DIAGNOSIS — M940 Chondrocostal junction syndrome [Tietze]: Secondary | ICD-10-CM | POA: Insufficient documentation

## 2021-08-09 NOTE — Progress Notes (Signed)
Terry Mathis,  Your barium swallow was unremarkable except for the presence of gastroesophageal reflux.  Do you recall if you experienced any of your chest pain symptoms during the study? If so, this would definitely suggest that your chest pain symptoms are indeed related to reflux and that we need to try other medications or increase your Nexium dose.   Has the chest pain gotten any better or worse since your clinic visit?

## 2021-09-03 ENCOUNTER — Ambulatory Visit (INDEPENDENT_AMBULATORY_CARE_PROVIDER_SITE_OTHER): Payer: 59 | Admitting: Gastroenterology

## 2021-09-03 ENCOUNTER — Encounter: Payer: Self-pay | Admitting: Gastroenterology

## 2021-09-03 ENCOUNTER — Other Ambulatory Visit (INDEPENDENT_AMBULATORY_CARE_PROVIDER_SITE_OTHER): Payer: 59

## 2021-09-03 VITALS — BP 164/88 | HR 99 | Ht 69.0 in | Wt 274.5 lb

## 2021-09-03 DIAGNOSIS — M940 Chondrocostal junction syndrome [Tietze]: Secondary | ICD-10-CM

## 2021-09-03 DIAGNOSIS — R7401 Elevation of levels of liver transaminase levels: Secondary | ICD-10-CM

## 2021-09-03 DIAGNOSIS — E119 Type 2 diabetes mellitus without complications: Secondary | ICD-10-CM

## 2021-09-03 DIAGNOSIS — R0789 Other chest pain: Secondary | ICD-10-CM

## 2021-09-03 DIAGNOSIS — K219 Gastro-esophageal reflux disease without esophagitis: Secondary | ICD-10-CM | POA: Diagnosis not present

## 2021-09-03 LAB — COMPREHENSIVE METABOLIC PANEL
ALT: 99 U/L — ABNORMAL HIGH (ref 0–53)
AST: 44 U/L — ABNORMAL HIGH (ref 0–37)
Albumin: 4.8 g/dL (ref 3.5–5.2)
Alkaline Phosphatase: 90 U/L (ref 39–117)
BUN: 16 mg/dL (ref 6–23)
CO2: 27 mEq/L (ref 19–32)
Calcium: 9.8 mg/dL (ref 8.4–10.5)
Chloride: 99 mEq/L (ref 96–112)
Creatinine, Ser: 0.8 mg/dL (ref 0.40–1.50)
GFR: 113.23 mL/min (ref >60.00)
Glucose, Bld: 311 mg/dL — ABNORMAL HIGH (ref 70–99)
Potassium: 3.7 mEq/L (ref 3.5–5.1)
Sodium: 137 mEq/L (ref 135–145)
Total Bilirubin: 0.4 mg/dL (ref 0.2–1.2)
Total Protein: 7.1 g/dL (ref 6.0–8.3)

## 2021-09-03 LAB — HEPATIC FUNCTION PANEL
ALT: 99 U/L — ABNORMAL HIGH (ref 0–53)
AST: 44 U/L — ABNORMAL HIGH (ref 0–37)
Albumin: 4.8 g/dL (ref 3.5–5.2)
Alkaline Phosphatase: 90 U/L (ref 39–117)
Bilirubin, Direct: 0.1 mg/dL (ref 0.0–0.3)
Total Bilirubin: 0.4 mg/dL (ref 0.2–1.2)
Total Protein: 7.1 g/dL (ref 6.0–8.3)

## 2021-09-03 LAB — CBC WITH DIFFERENTIAL/PLATELET
Basophils Absolute: 0.1 10*3/uL (ref 0.0–0.1)
Basophils Relative: 0.7 % (ref 0.0–3.0)
Eosinophils Absolute: 0.2 10*3/uL (ref 0.0–0.7)
Eosinophils Relative: 2.2 % (ref 0.0–5.0)
HCT: 45.2 % (ref 39.0–52.0)
Hemoglobin: 15.3 g/dL (ref 13.0–17.0)
Lymphocytes Relative: 37.4 % (ref 12.0–46.0)
Lymphs Abs: 2.9 10*3/uL (ref 0.7–4.0)
MCHC: 33.9 g/dL (ref 30.0–36.0)
MCV: 84.2 fl (ref 78.0–100.0)
Monocytes Absolute: 0.6 10*3/uL (ref 0.1–1.0)
Monocytes Relative: 7.1 % (ref 3.0–12.0)
Neutro Abs: 4.1 10*3/uL (ref 1.4–7.7)
Neutrophils Relative %: 52.6 % (ref 43.0–77.0)
Platelets: 230 10*3/uL (ref 150.0–400.0)
RBC: 5.37 Mil/uL (ref 4.22–5.81)
RDW: 12.8 % (ref 11.5–15.5)
WBC: 7.8 10*3/uL (ref 4.0–10.5)

## 2021-09-03 LAB — HEMOGLOBIN A1C: Hgb A1c MFr Bld: 10.3 % — ABNORMAL HIGH (ref 4.6–6.5)

## 2021-09-03 MED ORDER — ESOMEPRAZOLE MAGNESIUM 40 MG PO CPDR: 40.0000 mg | DELAYED_RELEASE_CAPSULE | Freq: Every day | ORAL | 3 refills | Status: AC

## 2021-09-03 NOTE — Progress Notes (Signed)
HPI : 35 year old male with GERD, hypertension and obesity and newly diagnosed untreated DM presents for clinic for follow up of chest pain.  He was originally seen for this symptom in July in the ER who felt it was most likely GERD and I saw him in early September.  His chest pain was different from his usual GERD symptoms of heartburn and I felt it to be more consistent with a musculoskeletal source of pain.  He was recommended to trial NSAIDs and return if the symptoms did not improve. A barium swallow was performed which did demonstrate the presence of reflux but was otherwise normal. He was also found to have hyperglycemia  and an elevated A1c at his ER visit and was offered metformin but he declined.   Today the patient reports that he is still having pain in his chest.  Today he is describing the pain as a pressure sensation in his upper chest which comes and goes.  He has not identified any triggers for the pain other than alcohol.  He sometimes notices it with activity, but not reliably.  Sometimes noticed more after meals, but not reliably.  Pain does not awaken him from sleep.  No associated symptoms such as nausea, LH/dizziness, dyspnea, diaphoresis.  No leg swelling or orthopnea. He had tingling pain in his left arm one time which is when he went to the ER in July.  He does admit to occasional cocaine use.  No other GI symptoms such as abdominal pain, constipation, diarrhea or hematochezia.  No dysphagia. He was also noted to have elevated liver enzymes in July, and admits to drinking heavily on the weekends. He still has not seen a PCP.  He did finally make an appointment, but his first appointment is not until December.  He reports having polyuria, denies blurry vision.   Past Medical History:  Diagnosis Date   GERD (gastroesophageal reflux disease)    Hypertension      Past Surgical History:  Procedure Laterality Date   WISDOM TOOTH EXTRACTION     Family History  Problem  Relation Age of Onset   Diabetes Mother        on dialysis   Seizures Mother    Hypertension Father    Colon cancer Neg Hx    Esophageal cancer Neg Hx    Stomach cancer Neg Hx    Social History   Tobacco Use   Smoking status: Some Days    Types: Cigarettes   Smokeless tobacco: Never  Vaping Use   Vaping Use: Never used  Substance Use Topics   Alcohol use: Yes    Comment: 2x week   Drug use: Never   Current Outpatient Medications  Medication Sig Dispense Refill   esomeprazole (NEXIUM) 20 MG capsule Take 20 mg by mouth daily at 12 noon.     ibuprofen (ADVIL) 800 MG tablet Take 1 tablet (800 mg total) by mouth 3 (three) times daily. 20 tablet 0   amLODipine (NORVASC) 5 MG tablet Take 1 tablet (5 mg total) by mouth daily. 30 tablet 0   No current facility-administered medications for this visit.   Allergies  Allergen Reactions   Amoxicillin Nausea Only    Fast heart beat     Review of Systems: All systems reviewed and negative except where noted in HPI.    DG ESOPHAGUS W DOUBLE CM (HD)  Result Date: 08/05/2021 CLINICAL DATA:  Chronic gastroesophageal reflux.  Chest pain EXAM: ESOPHOGRAM / BARIUM  SWALLOW / BARIUM TABLET STUDY TECHNIQUE: Combined double contrast and single contrast examination performed using effervescent crystals, thick barium liquid, and thin barium liquid. The patient was observed with fluoroscopy swallowing a 13 mm barium sulphate tablet. FLUOROSCOPY TIME:  Fluoroscopy Time:  1 minute 54 seconds Radiation Exposure Index (if provided by the fluoroscopic device): 94.1 mm Number of Acquired Spot Images: 8 COMPARISON:  None. FINDINGS: No swallowing dysfunction in the high cervical esophagus. No mucosal irregularity within thoracic esophagus or distal esophagus contrast passed GE junction easily. With patient in prone position normal esophageal motility is demonstrated. Wiht patient in supine position moderate gastroesophageal reflux was demonstrated. No hiatal  hernia 13 mm barium tablet GE junction easily IMPRESSION: 1. No mucosal irregularity within the esophagus. No stricture or mass. 2. Normal esophageal motility. 3. Moderate gastroesophageal reflux demonstrated during the course exam. Electronically Signed   By: Genevive Bi M.D.   On: 08/05/2021 11:12    Physical Exam: BP (!) 164/88 (BP Location: Left Arm, Patient Position: Sitting, Cuff Size: Normal)   Pulse 99   Ht 5\' 9"  (1.753 m)   Wt 274 lb 8 oz (124.5 kg)   BMI 40.54 kg/m  Constitutional: Pleasant,well-developed, Caucasian male in no acute distress. HEENT: Normocephalic and atraumatic. Conjunctivae are normal. No scleral icterus. Cardiovascular: Normal rate, regular rhythm.  No tenderness to palpation over the sternum or ribs Pulmonary/chest: Effort normal and breath sounds normal. No wheezing, rales or rhonchi.   Abdominal: Soft, nondistended, nontender. Bowel sounds active throughout. There are no masses palpable. No hepatomegaly. Extremities: no edema Neurological: Alert and oriented to person place and time. Skin: Skin is warm and dry. No rashes noted. Psychiatric: Normal mood and affect. Behavior is normal.  CBC    Component Value Date/Time   WBC 8.0 05/20/2021 1228   RBC 5.84 (H) 05/20/2021 1228   HGB 16.6 05/20/2021 1228   HCT 49.1 05/20/2021 1228   PLT 276 05/20/2021 1228   MCV 84.1 05/20/2021 1228   MCV 90.7 11/15/2013 1859   MCH 28.4 05/20/2021 1228   MCHC 33.8 05/20/2021 1228   RDW 11.8 05/20/2021 1228   LYMPHSABS 3.1 05/20/2021 1228   MONOABS 0.7 05/20/2021 1228   EOSABS 0.2 05/20/2021 1228   BASOSABS 0.1 05/20/2021 1228    CMP     Component Value Date/Time   NA 135 05/20/2021 1228   K 4.2 05/20/2021 1228   CL 99 05/20/2021 1228   CO2 28 05/20/2021 1228   GLUCOSE 269 (H) 05/20/2021 1228   BUN 11 05/20/2021 1228   CREATININE 0.82 05/20/2021 1228   CREATININE 0.96 11/15/2013 1859   CALCIUM 9.9 05/20/2021 1228   PROT 7.4 05/20/2021 1228   ALBUMIN  4.5 05/20/2021 1228   AST 147 (H) 05/20/2021 1228   ALT 218 (H) 05/20/2021 1228   ALKPHOS 90 05/20/2021 1228   BILITOT 0.7 05/20/2021 1228   GFRNONAA >60 05/20/2021 1228     ASSESSMENT AND PLAN: 35 year old male with newly diagnosed, untreated diabetes, obesity, GERD and hypertension with persistent atypical chest pain which is different than his usual heartburn.  He describes the chest pain a little differently at this visit, and has not tenderness to palpation, so I no longer suspect an MSK source.  It is possible that this pain is a manifestation of his GERD, but cardiac etiologies also should be pursued given his risk factors.  I strongly recommended the patient abstain from cocaine use as this increases his risk for a cardiac event.  Will increase his Nexium to 40 mg PO daily for now.   I suspect he probably has some degree of hepatic steatosis based on his NAFLD risk factors and alcohol history.  Will repeat liver enzymes and get ultrasound For his diabetes, he needs to be started on therapy very soon.  I have not treated diabetes in over 10 years, and so am not entirely comfortable starting him on a regimen. He has an appointment with his PCM, but not for another 2 months.  I will reach to Dr. Yetta Barre to see if he would be comfortable providing guidance on initiating diabetic therapy without having seen the patient first.   Atypical chest pain - Increase Nexium 40 mg PO daily - Consider GXT - Stop cocaine use - An EGD would not be therapeutic at this point, and would potentially only subject him to increased risk if his pain is cardiac-related.    Elevated liver enzymes - Repeat CMP - Ultrasound  Diabetes - Repeat A1c to see if significant interval change - Refer to dietician for guidance on improving diet to help manage hyperglycemia - Will discuss with Dr. Yetta Barre initial DM medication (hesitant about metformin given questionable liver disease)  HTN - Continue amlodipine  Terry Mathis  E. Tomasa Rand, MD [facility]

## 2021-09-03 NOTE — Patient Instructions (Signed)
If you are age 37 or older, your body mass index should be between 23-30. Your Body mass index is 40.54 kg/m. If this is out of the aforementioned range listed, please consider follow up with your Primary Care Provider.  If you are age 6 or younger, your body mass index should be between 19-25. Your Body mass index is 40.54 kg/m. If this is out of the aformentioned range listed, please consider follow up with your Primary Care Provider  You will be contacted by [facility] Scheduling in the next 2 days to arrange a RUQ ultrasound.  The number on your caller ID will be [phone], please answer when they call.  If you have not heard from them in 2 days please call [phone] to schedule.     You have been scheduled for an abdominal ultrasound at [facility] (1st floor of hospital) on at . Please arrive 15 minutes prior to your appointment for registration. Make certain not to have anything to eat or drink 6 hours prior to your appointment. Should you need to reschedule your appointment, please contact radiology at [phone]. This test typically takes about 30 minutes to perform.  Your provider has requested that you go to the basement level for lab work before leaving today. Press "B" on the elevator. The lab is located at the first door on the left as you exit the elevator.  Due to recent changes in healthcare laws, you may see the results of your imaging and laboratory studies on MyChart before your provider has had a chance to review them.  We understand that in some cases there may be results that are confusing or concerning to you. Not all laboratory results come back in the same time frame and the provider may be waiting for multiple results in order to interpret others.  Please give [place] 48 hours in order for your provider to thoroughly review all the results before contacting the office for clarification of your results.    The [place] [facility] providers would like to encourage  you to use [facility] to communicate with providers for non-urgent requests or questions.  Due to long hold times on the telephone, sending your provider a message by [facility] may be a faster and more efficient way to get a response.  Please allow 48 business hours for a response.  Please remember that this is for non-urgent requests.   It was a pleasure to see you today!  Thank you for trusting me with your gastrointestinal care!    Scott E. Tomasa Rand, MD

## 2021-09-05 ENCOUNTER — Encounter: Payer: Self-pay | Admitting: Gastroenterology

## 2021-09-09 ENCOUNTER — Ambulatory Visit (HOSPITAL_BASED_OUTPATIENT_CLINIC_OR_DEPARTMENT_OTHER)
Admission: RE | Admit: 2021-09-09 | Discharge: 2021-09-09 | Disposition: A | Discharge status: Home / Self Care | Payer: 59 | Source: Ambulatory Visit | Attending: Gastroenterology | Admitting: Gastroenterology

## 2021-09-09 ENCOUNTER — Other Ambulatory Visit: Payer: Self-pay

## 2021-09-09 DIAGNOSIS — E119 Type 2 diabetes mellitus without complications: Secondary | ICD-10-CM | POA: Diagnosis not present

## 2021-09-09 DIAGNOSIS — R7401 Elevation of levels of liver transaminase levels: Secondary | ICD-10-CM | POA: Insufficient documentation

## 2021-11-06 ENCOUNTER — Ambulatory Visit: Payer: 59 | Admitting: Internal Medicine

## 2022-01-20 NOTE — Progress Notes (Signed)
Maya,  Can you contact Mr. Kober and get a follow up visit scheduled to discuss his fatty liver?

## 2022-02-14 ENCOUNTER — Ambulatory Visit: Payer: 59 | Admitting: Gastroenterology

## 2022-03-17 ENCOUNTER — Ambulatory Visit: Payer: 59 | Admitting: Gastroenterology

## 2022-09-18 IMAGING — CR DG CHEST 2V
2 series · 2 of 2 positions shown · non-contrast
Comparison: None.

CLINICAL DATA: Chest pain.

EXAM:
CHEST - 2 VIEW

[chest pa]
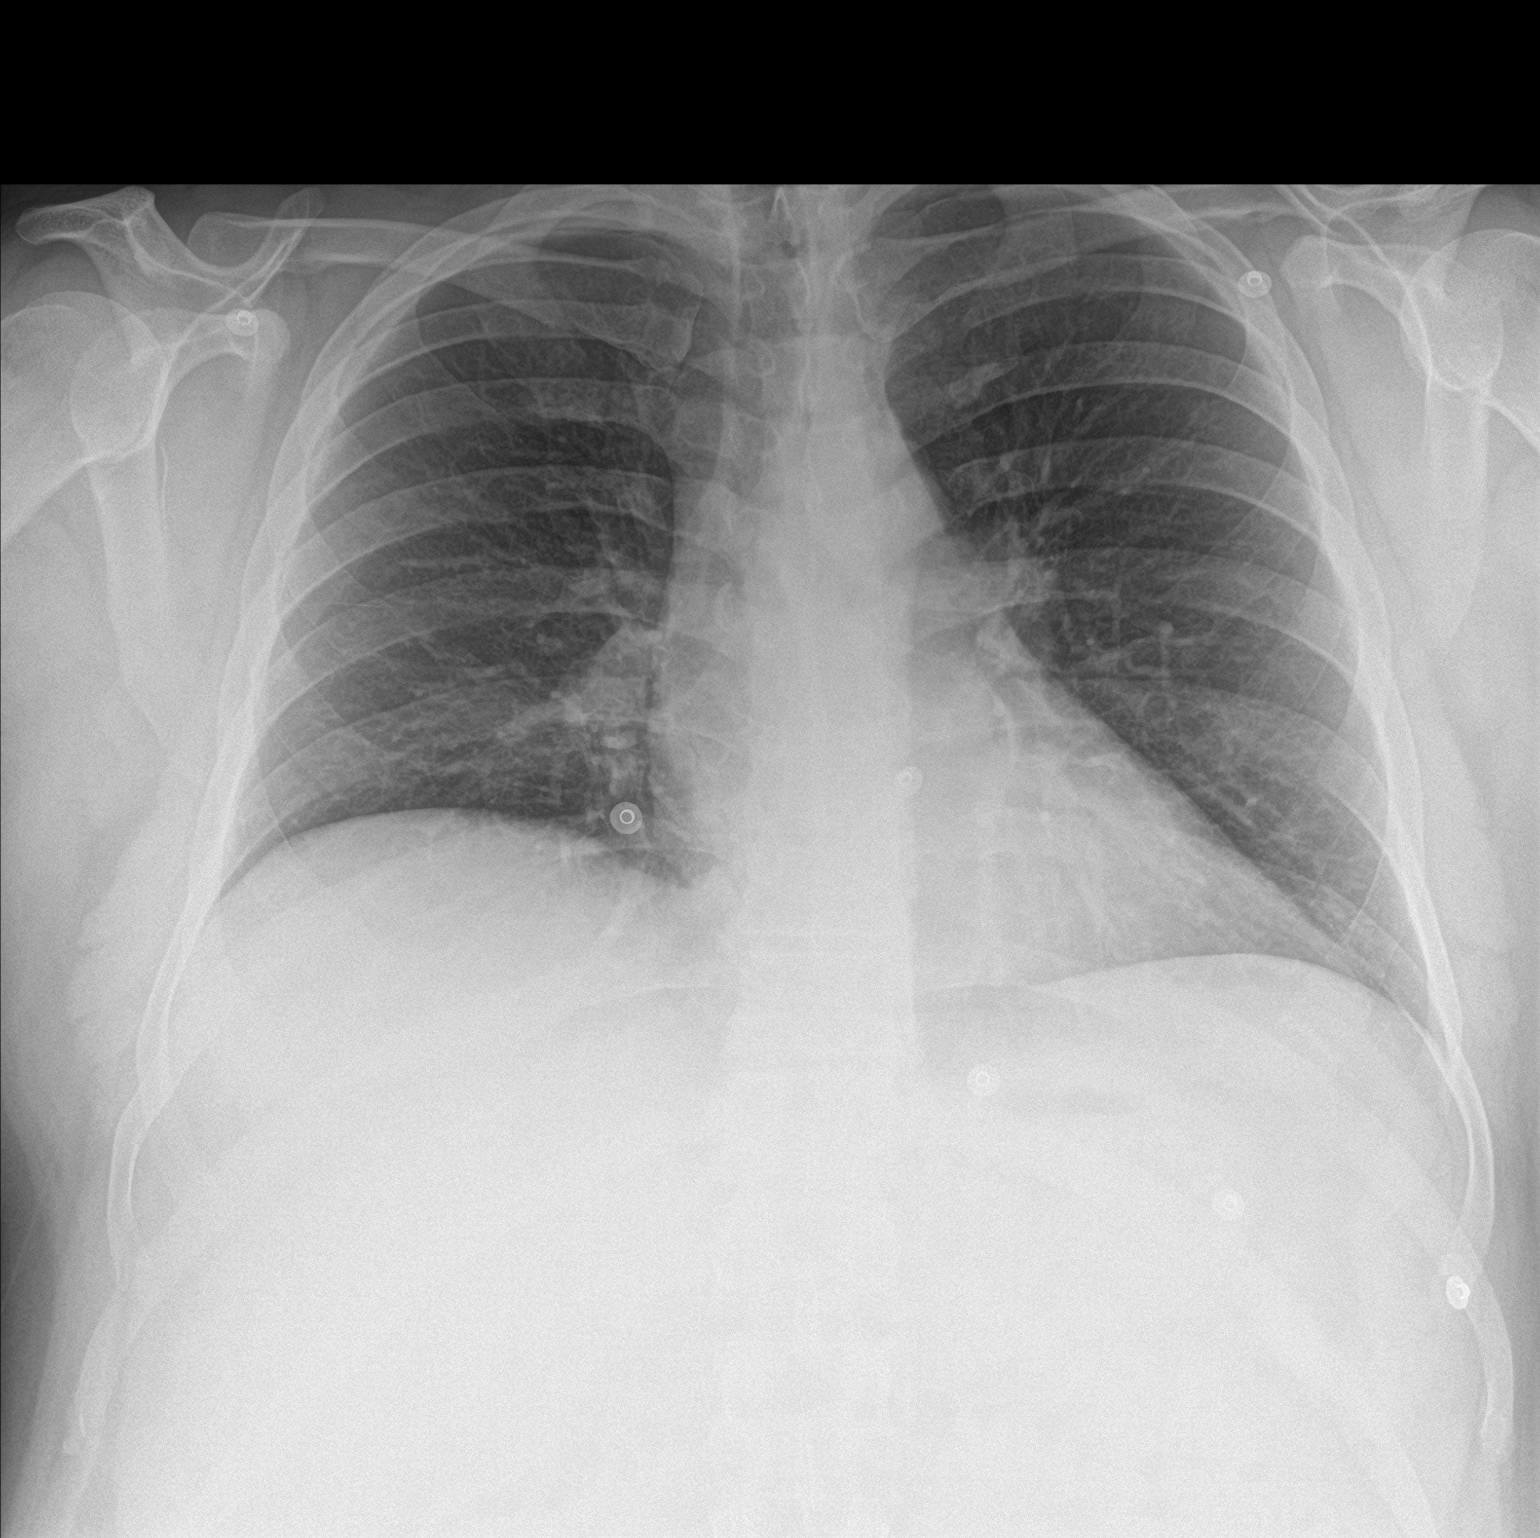

[chest lat]
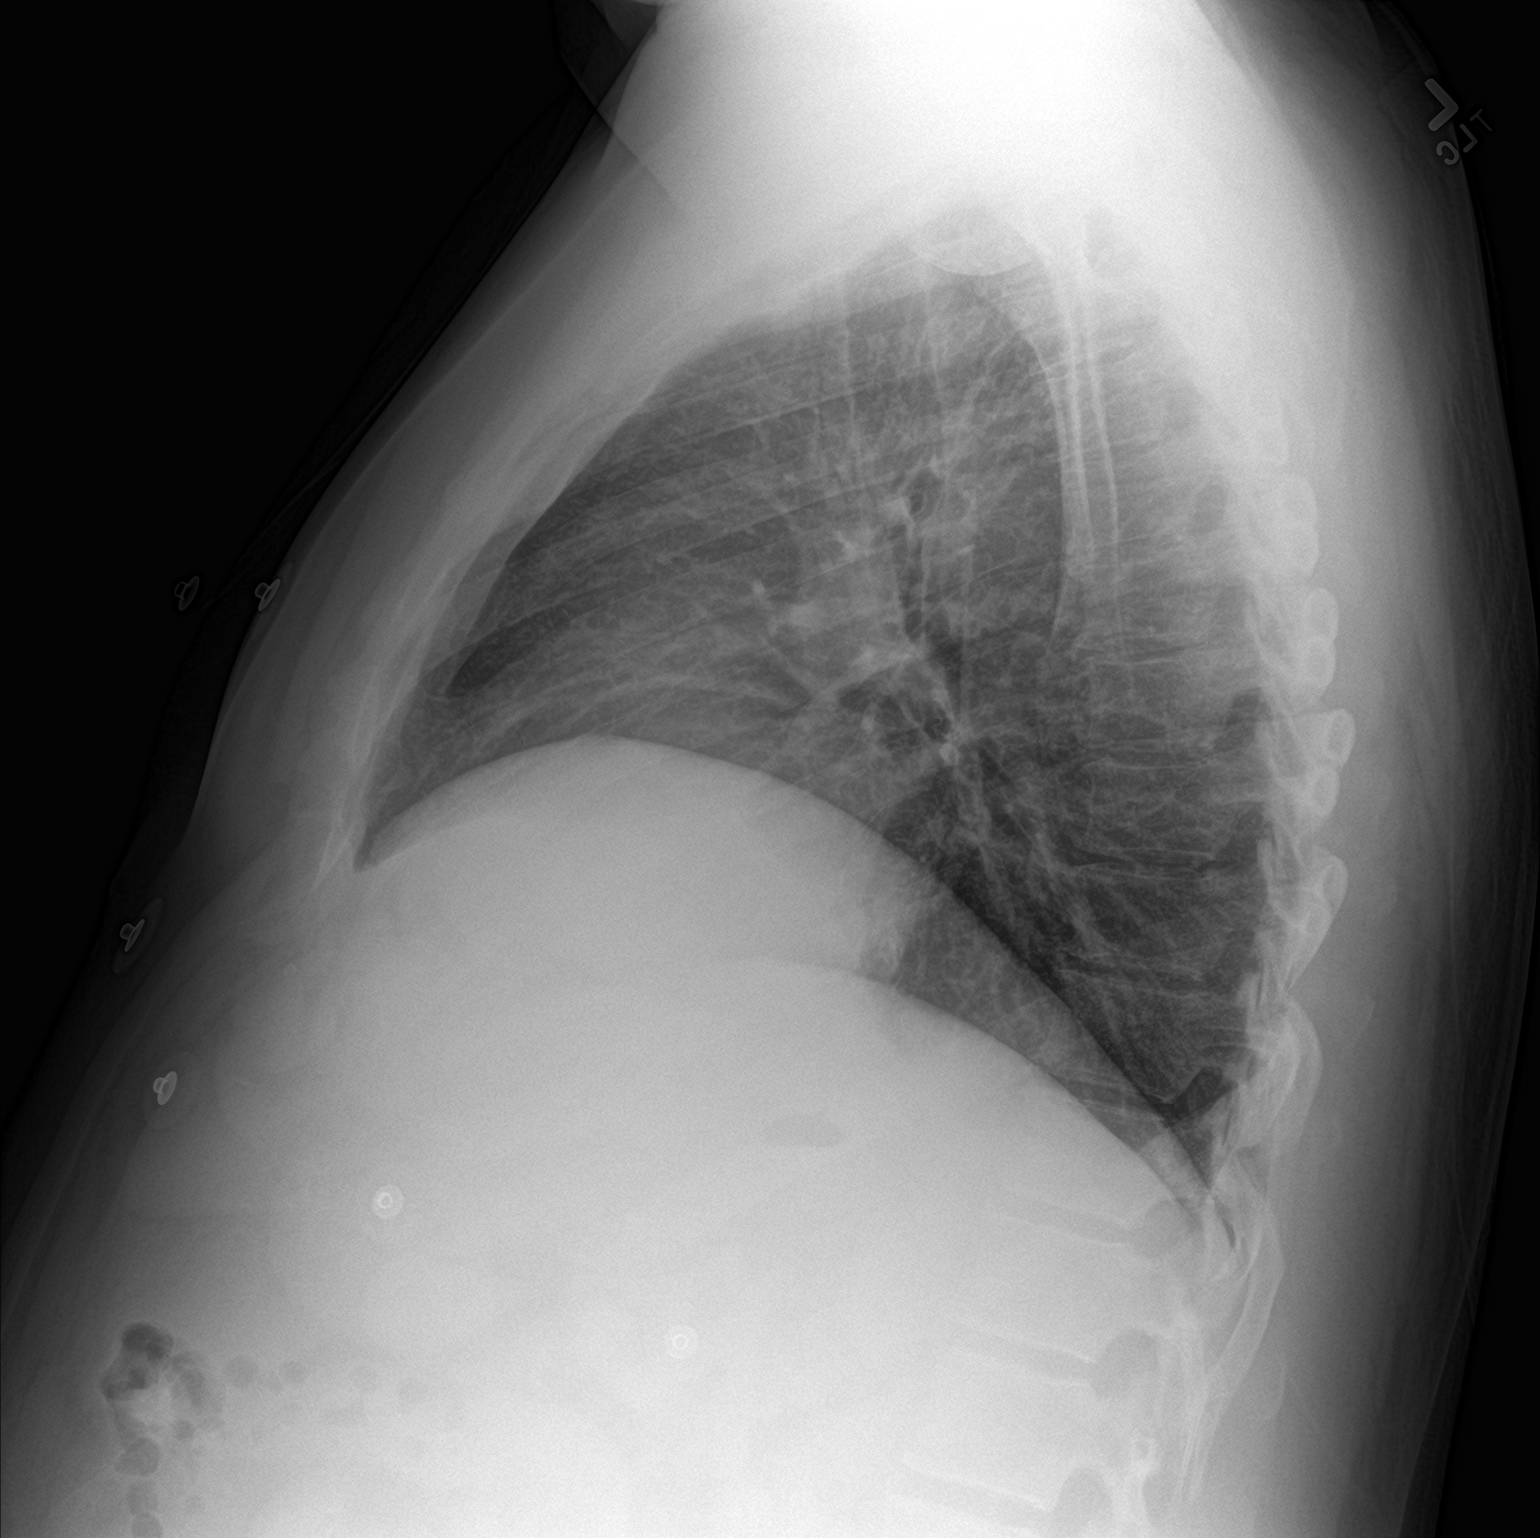

[2 of 2 positions shown; findings below may reference images not displayed]

FINDINGS: The heart size and mediastinal contours are within normal limits.
Both lungs are clear. The visualized skeletal structures are
unremarkable.
IMPRESSION: No active cardiopulmonary disease.

## 2022-12-04 IMAGING — RF DG ESOPHAGUS
9 of 10 series · 12 of 24 positions shown · non-contrast
Comparison: None.

CLINICAL DATA: Chronic gastroesophageal reflux.  Chest pain

EXAM:
ESOPHOGRAM / BARIUM SWALLOW / BARIUM TABLET STUDY
TECHNIQUE: Combined double contrast and single contrast examination performed
using effervescent crystals, thick barium liquid, and thin barium
liquid. The patient was observed with fluoroscopy swallowing a 13 mm
barium sulphate tablet.
FLUOROSCOPY TIME:  Fluoroscopy Time:  1 minute 54 seconds
Radiation Exposure Index (if provided by the fluoroscopic device):
94.1 mm
Number of Acquired Spot Images: 8

[Series 1: cp_standard · 0.51mm/px · 2 of 102 frames shown (1 of 5)]
[frame 16/102]
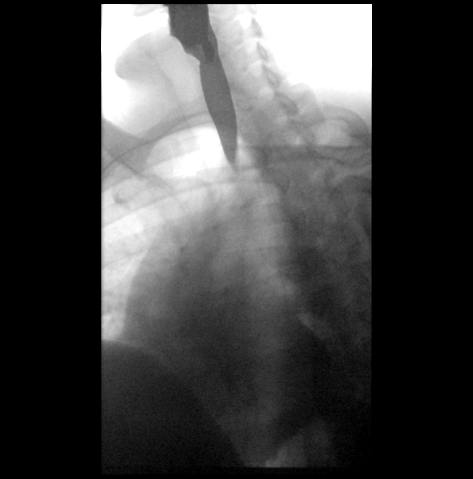
[frame 87/102]
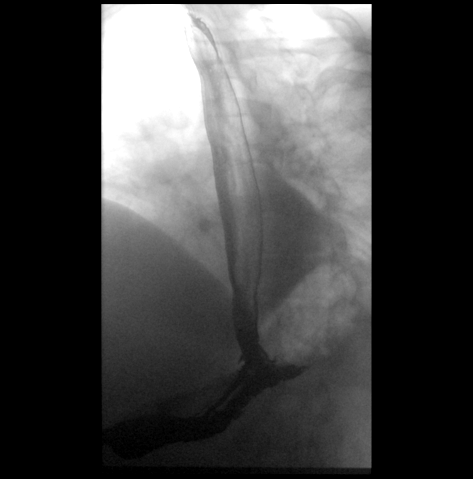

[Series 2: cp_standard · 0.51mm/px · 1 of 59 frames shown (2 of 5)]
[frame 9/59]
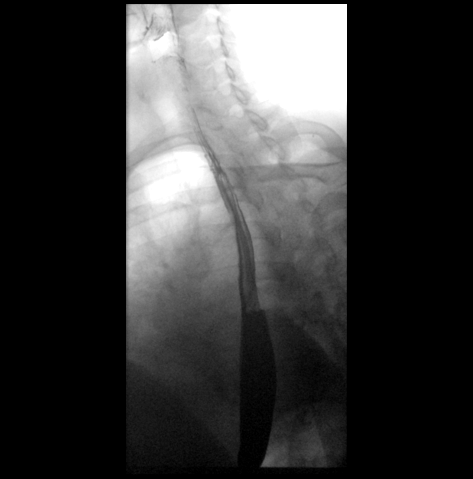

[Series 3: cp_standard · 0.35mm/px · 2 of 78 frames shown (3 of 5)]
[frame 12/78]
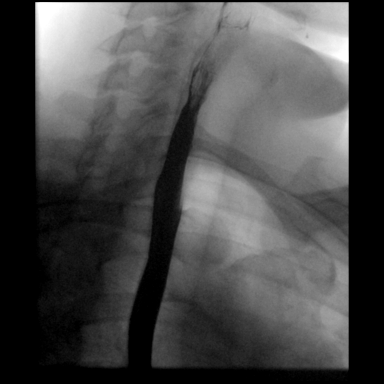
[frame 40/78]
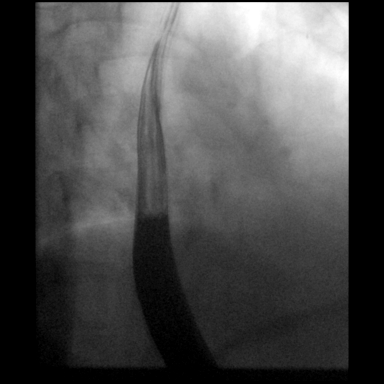

[Series 4: fluoro_barium 2fps_bw · 0.17mm/px · 1 of 2 frames shown (1 of 4)]
[frame 1/2]
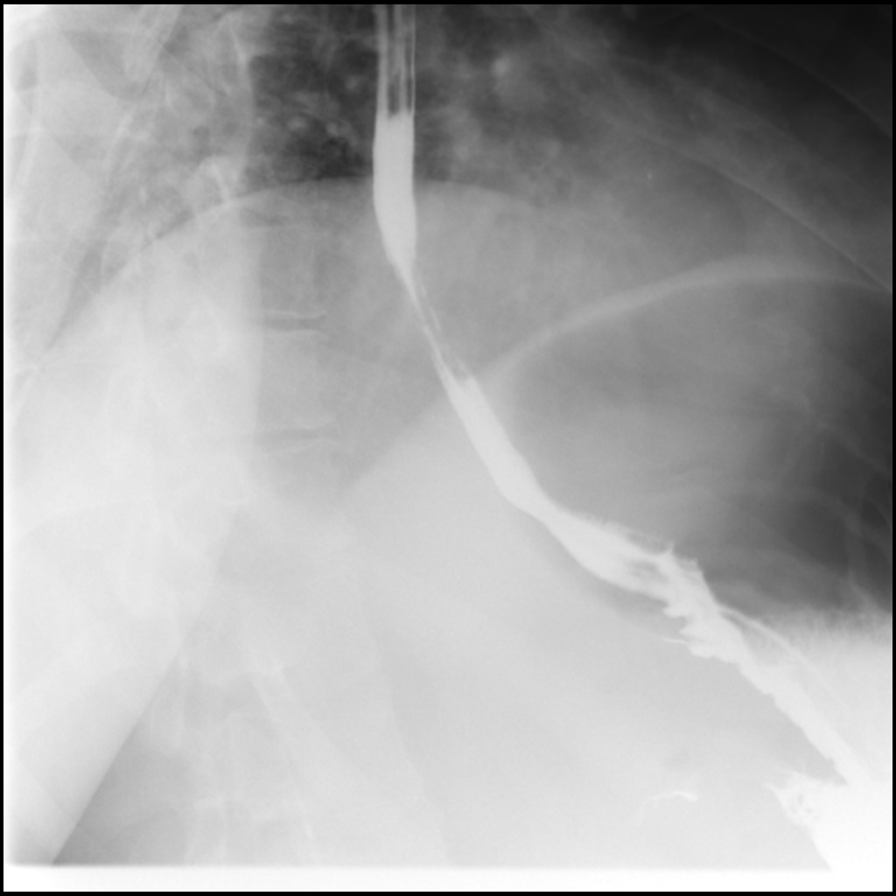

[Series 5: cp_standard · 0.35mm/px · 2 of 103 frames shown (4 of 5)]
[frame 16/103]
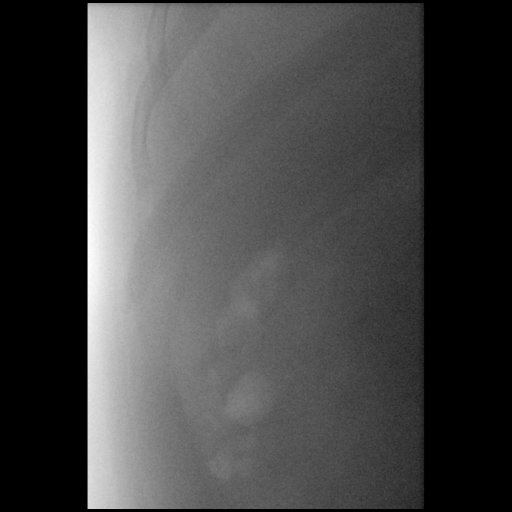
[frame 64/103]
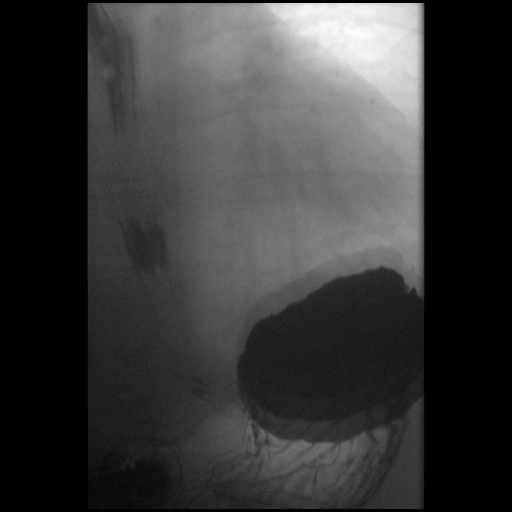

[Series 6: fluoro_barium 2fps_bw · 0.17mm/px · 1 of 2 frames shown (2 of 4)]
[frame 1/2]
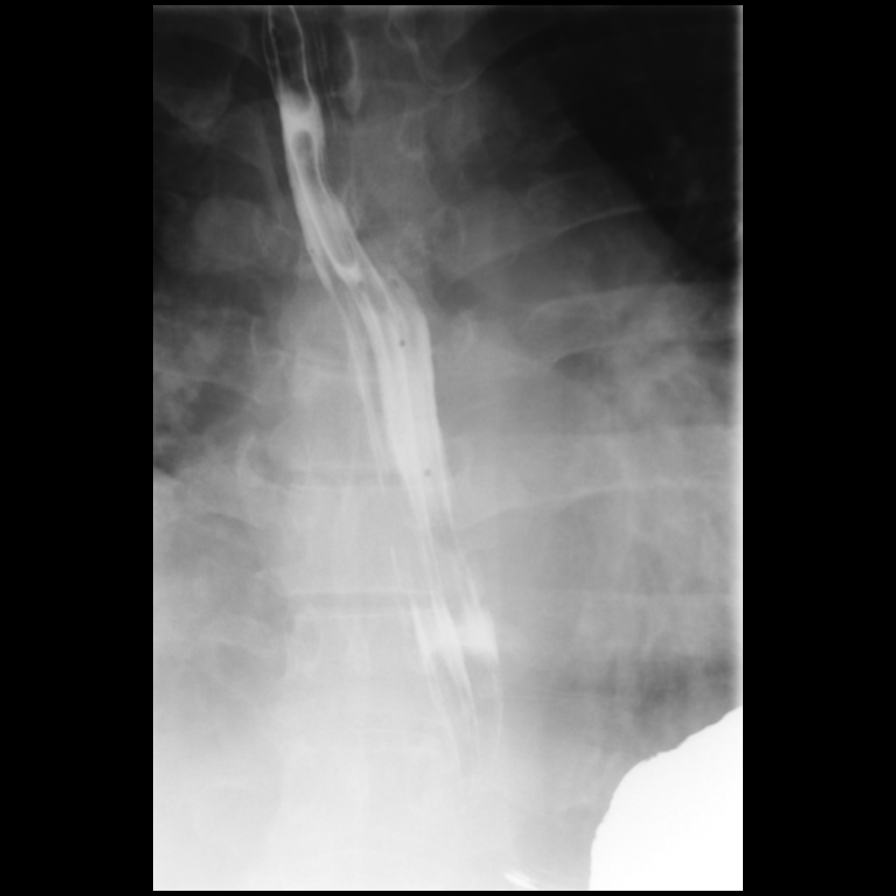

[Series 7: fluoro_barium 2fps_bw · 0.17mm/px · 1 of 2 frames shown (3 of 4)]
[frame 2/2]
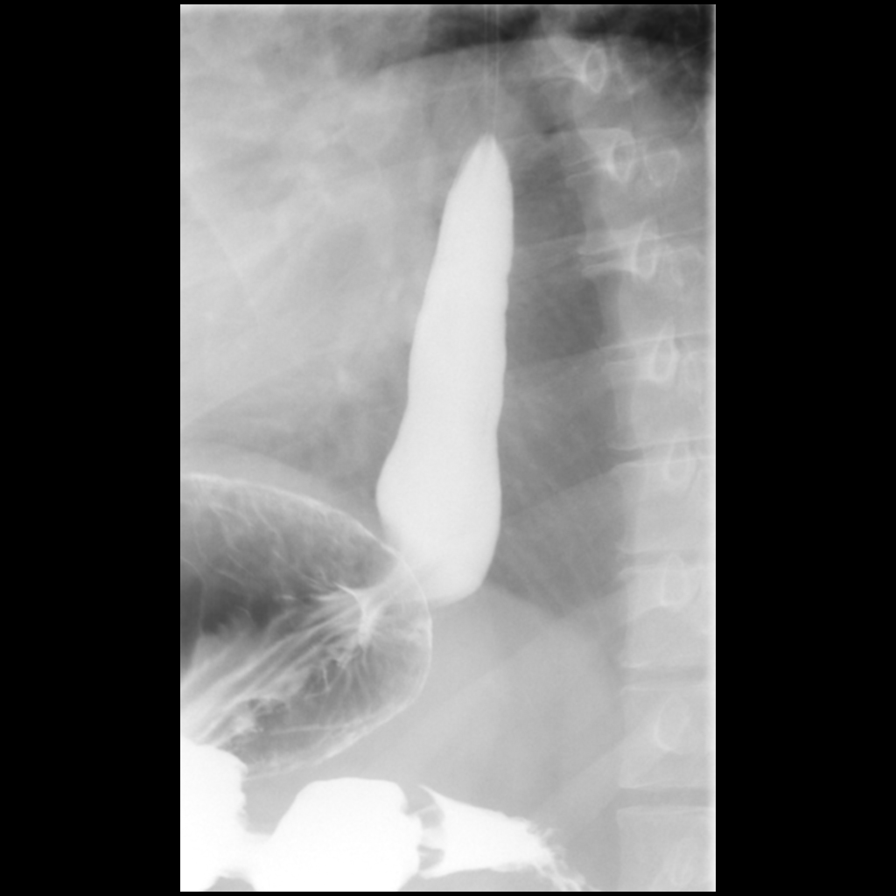

[Series 8: fluoro_barium 2fps_bw · 0.17mm/px · 1 of 2 frames shown (4 of 4)]
[frame 2/2]
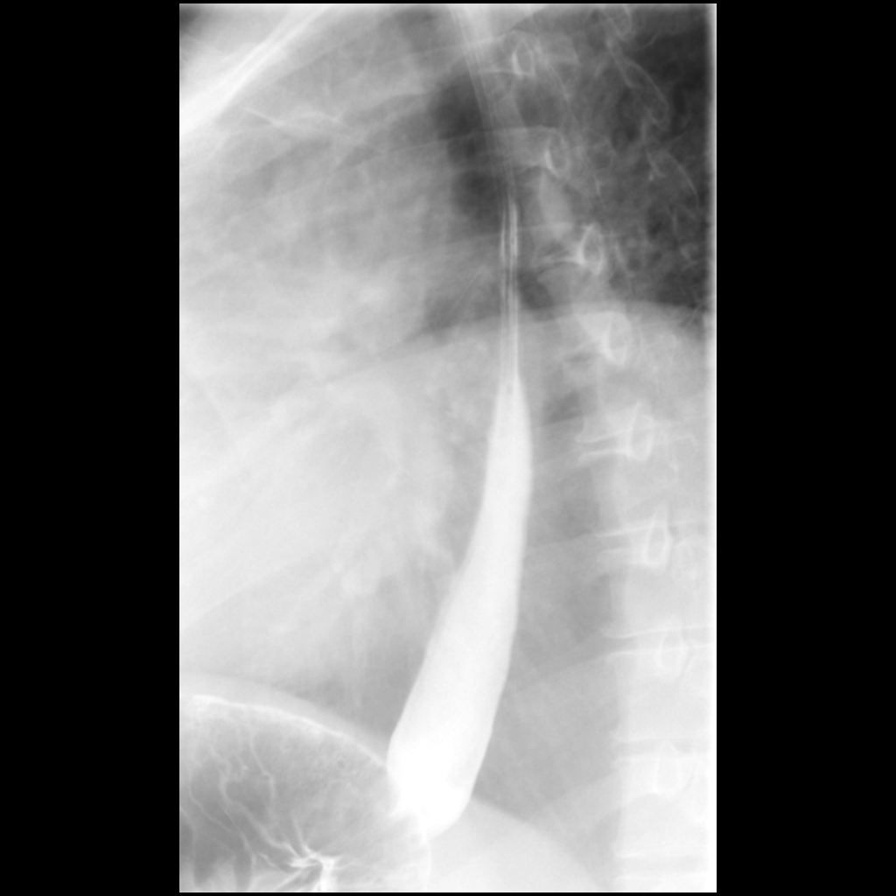

[Series 10: cp_standard · 0.18mm/px · 1 of 1 slices shown (5 of 5)]
[im 1/1]
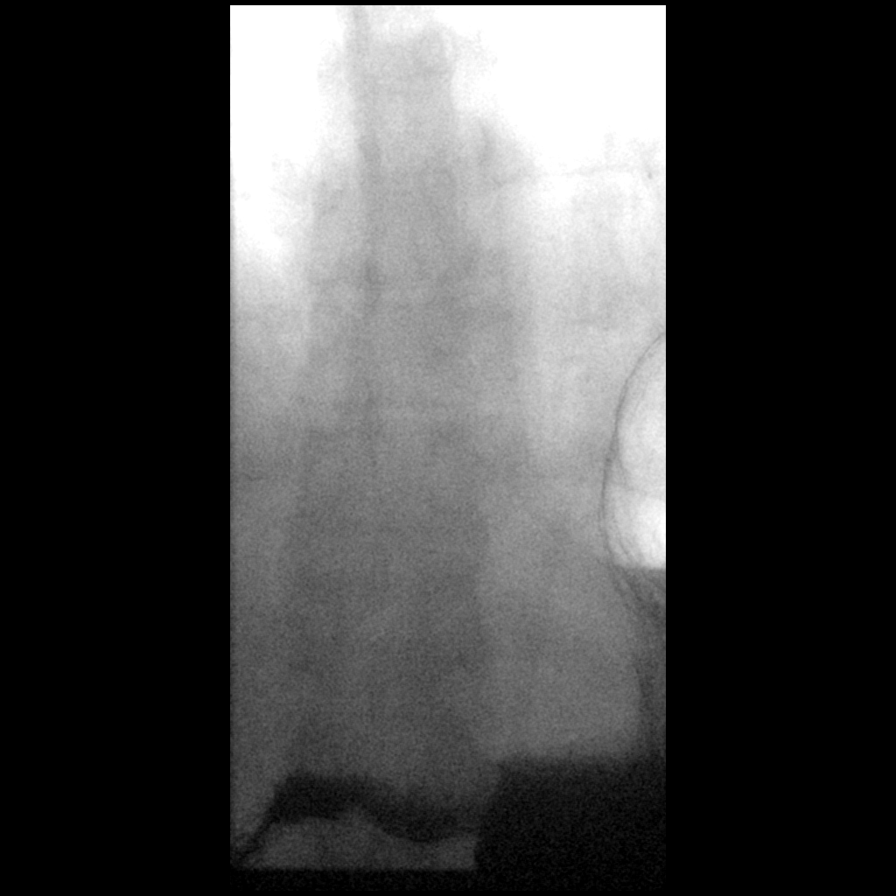

[12 of 24 positions shown; findings below may reference images not displayed]

FINDINGS: No swallowing dysfunction in the high cervical esophagus. No mucosal
irregularity within thoracic esophagus or distal esophagus contrast
passed GE junction easily.

With patient in prone position normal esophageal motility is
demonstrated.

Wiht patient in supine position moderate gastroesophageal reflux was
demonstrated. No hiatal hernia

13 mm barium tablet GE junction easily
IMPRESSION: 1. No mucosal irregularity within the esophagus. No stricture or
mass.
2. Normal esophageal motility.
3. Moderate gastroesophageal reflux demonstrated during the course
exam.

## 2023-01-08 IMAGING — US US ABDOMEN LIMITED
1 series · 14 of 25 positions shown · non-contrast
Comparison: None.

CLINICAL DATA: Elevated liver enzymes.

EXAM:
ULTRASOUND ABDOMEN LIMITED RIGHT UPPER QUADRANT

[Series 1: us abdomen limited ruq (liver/gb) · 14 of 43 slices shown]
[im 1/43]
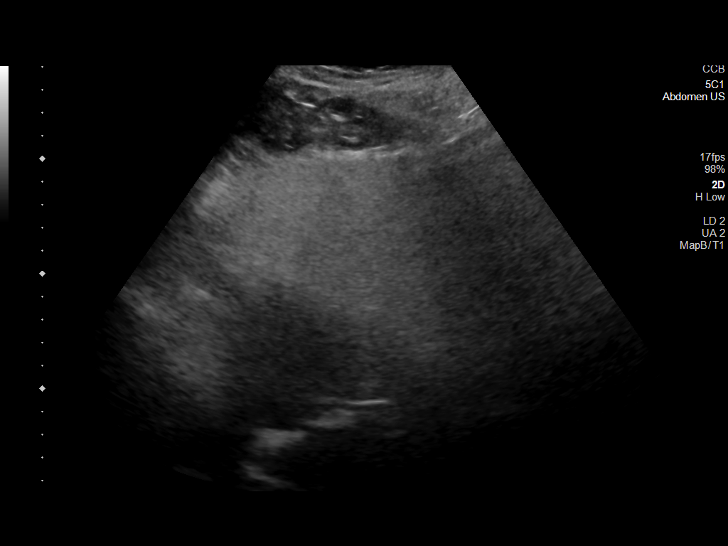
[im 4/43]
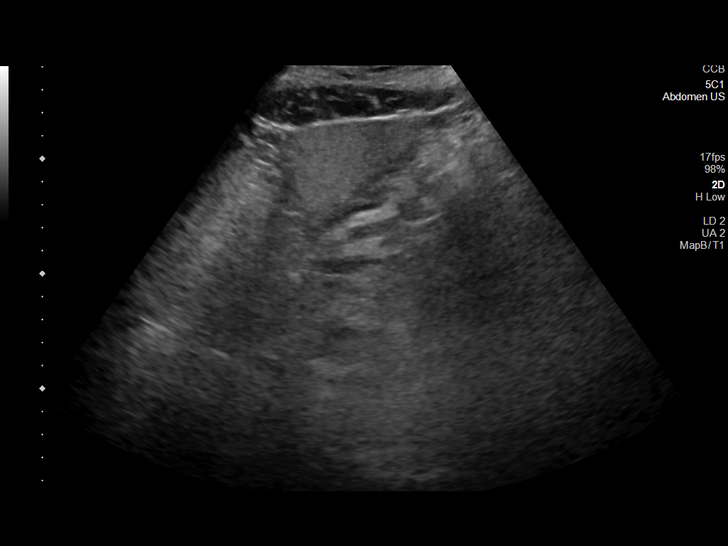
[im 8/43]
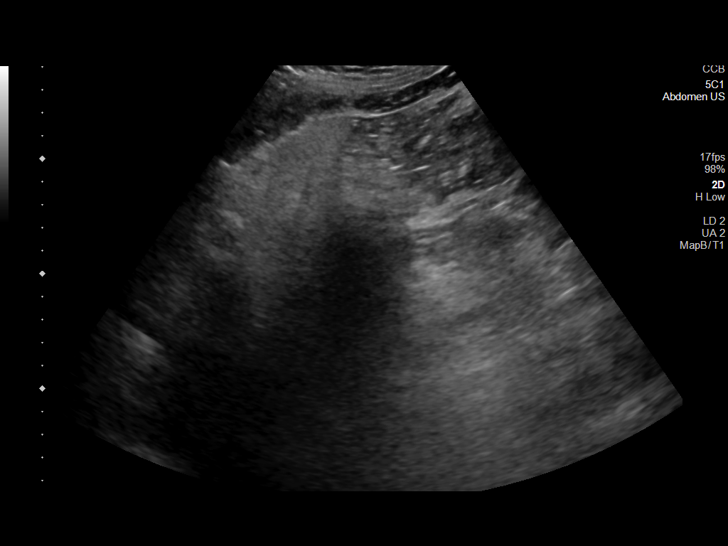
[im 11/43]
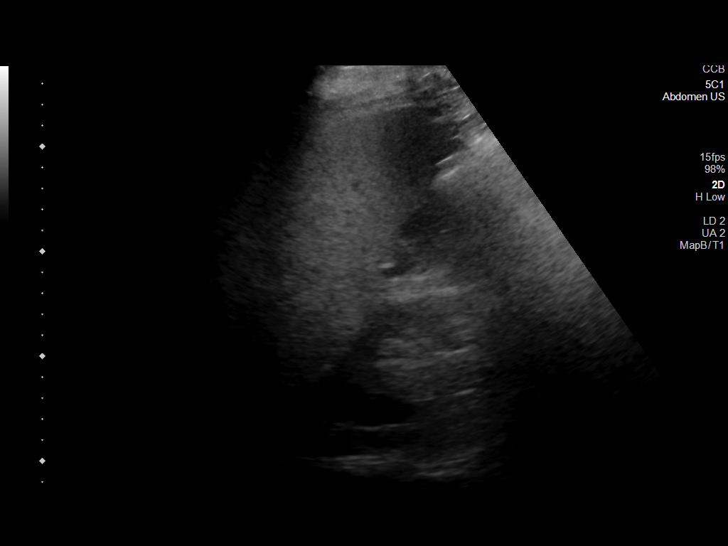
[im 15/43]
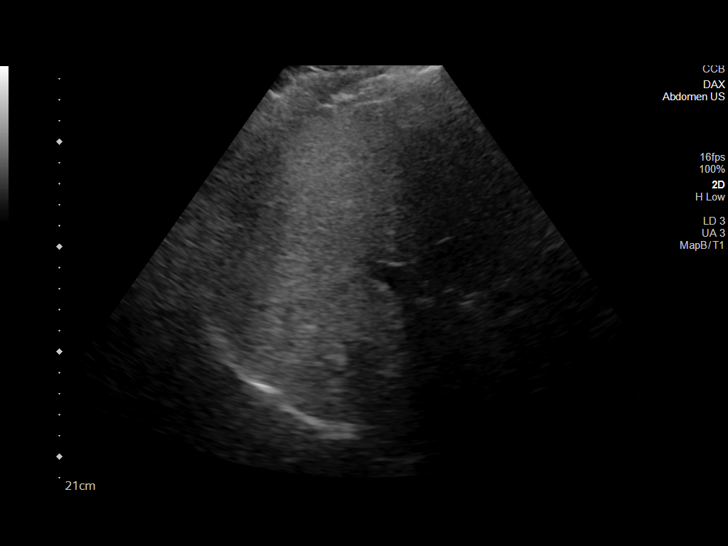
[im 16/43]
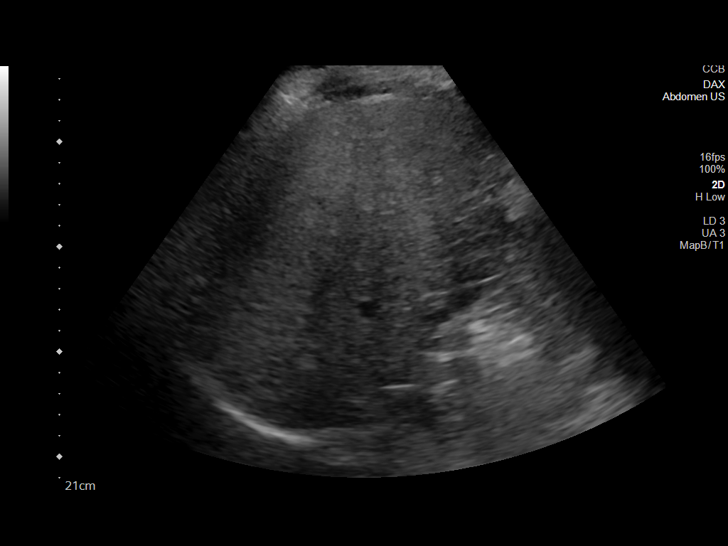
[im 20/43]
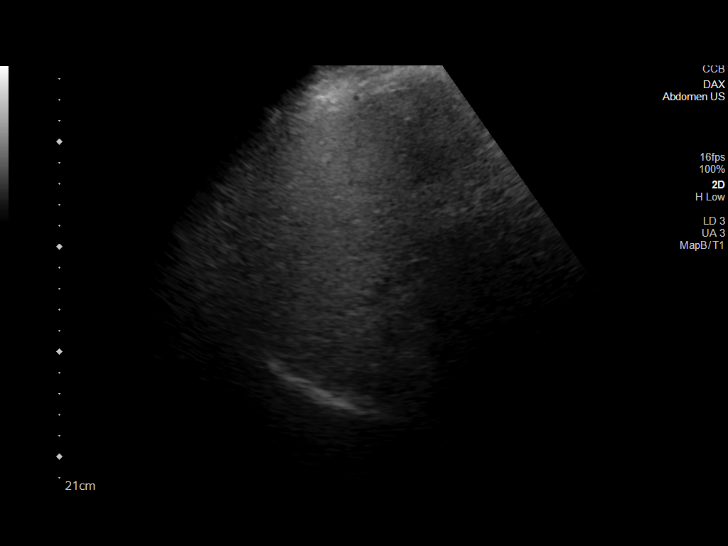
[im 23/43]
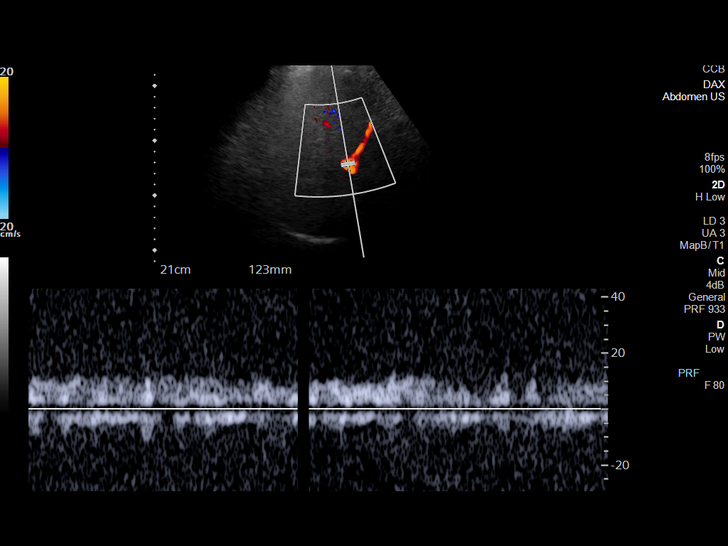
[im 27/43]
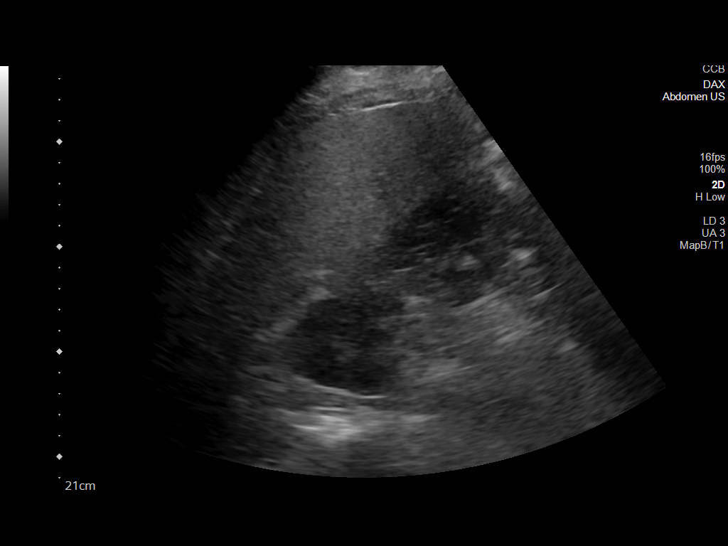
[im 29/43]
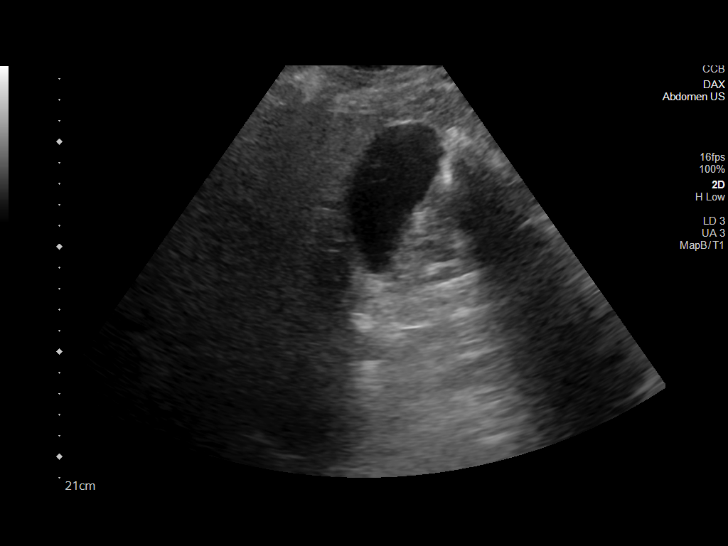
[im 32/43]
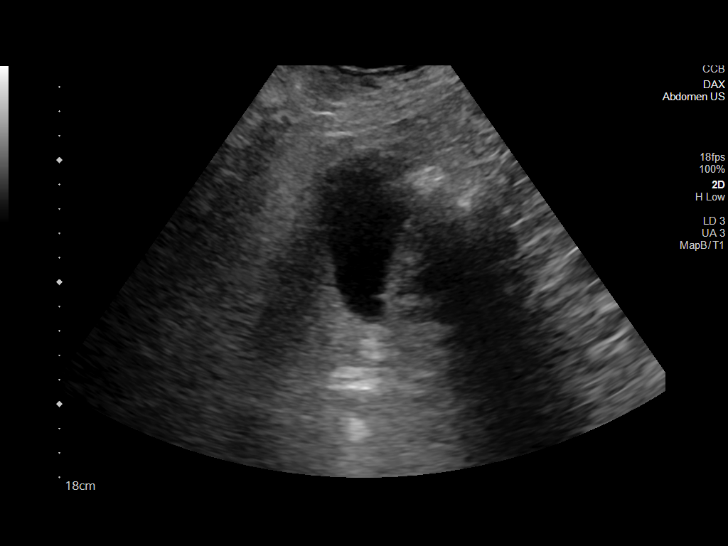
[im 36/43]
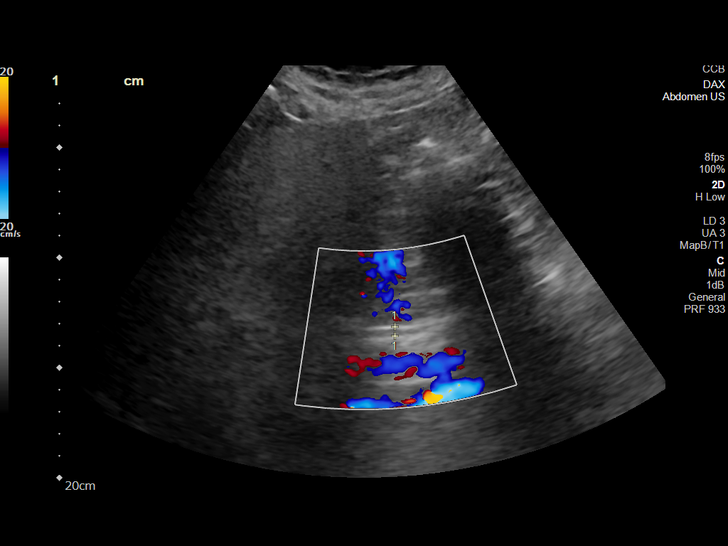
[im 39/43]
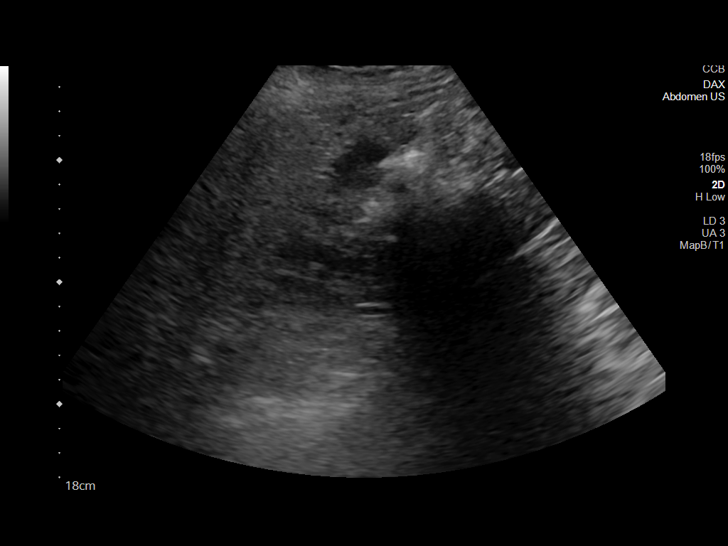
[im 43/43]
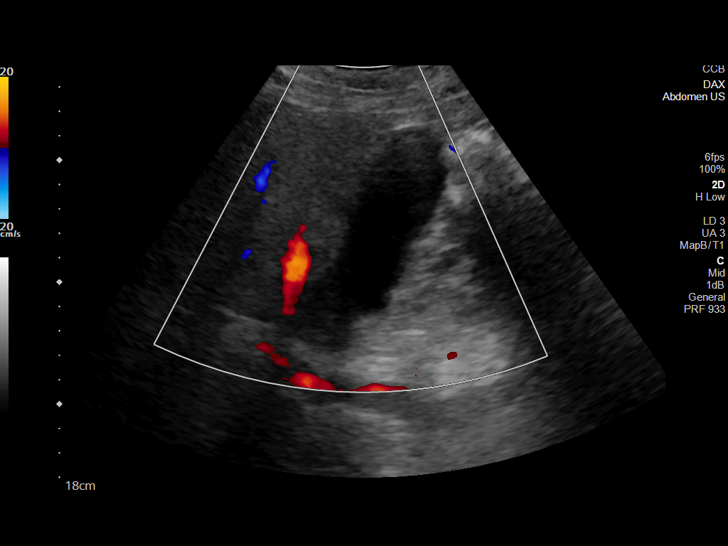

[14 of 25 positions shown; findings below may reference images not displayed]

FINDINGS: Gallbladder:

No gallstones or wall thickening visualized. No sonographic Murphy
sign noted by sonographer.

Common bile duct:

Diameter: 4 mm

Liver:

There is diffuse increased liver echogenicity most commonly seen in
the setting of fatty infiltration. Superimposed inflammation or
fibrosis is not excluded. Clinical correlation is recommended.
Portal vein is patent on color Doppler imaging with normal direction
of blood flow towards the liver.

Other: None.
IMPRESSION: Fatty liver, otherwise unremarkable right upper quadrant ultrasound.
